# Patient Record
Sex: Female | Born: 1985 | Race: White | Hispanic: No | Marital: Married | Smoking: Never smoker
Health system: Southern US, Community
[De-identification: ages and names within clinical notes are randomized; demographics above are authoritative.]

## PROBLEM LIST (undated history)

## (undated) DIAGNOSIS — N952 Postmenopausal atrophic vaginitis: Secondary | ICD-10-CM

## (undated) DIAGNOSIS — M5030 Other cervical disc degeneration, unspecified cervical region: Secondary | ICD-10-CM

## (undated) DIAGNOSIS — F419 Anxiety disorder, unspecified: Secondary | ICD-10-CM

## (undated) DIAGNOSIS — R87629 Unspecified abnormal cytological findings in specimens from vagina: Secondary | ICD-10-CM

## (undated) HISTORY — PX: TONSILLECTOMY: SUR1361

## (undated) HISTORY — PX: COLPOSCOPY: SHX161

## (undated) HISTORY — DX: Anxiety disorder, unspecified: F41.9

## (undated) HISTORY — DX: Postmenopausal atrophic vaginitis: N95.2

## (undated) HISTORY — DX: Unspecified abnormal cytological findings in specimens from vagina: R87.629

## (undated) HISTORY — DX: Other cervical disc degeneration, unspecified cervical region: M50.30

---

## 2005-02-16 ENCOUNTER — Emergency Department: Payer: Self-pay | Admitting: Emergency Medicine

## 2005-11-29 ENCOUNTER — Emergency Department: Payer: Self-pay | Admitting: Emergency Medicine

## 2006-03-07 ENCOUNTER — Emergency Department: Payer: Self-pay | Admitting: Emergency Medicine

## 2006-07-08 ENCOUNTER — Emergency Department: Payer: Self-pay | Admitting: Emergency Medicine

## 2006-08-07 ENCOUNTER — Emergency Department: Payer: Self-pay | Admitting: Emergency Medicine

## 2006-10-08 ENCOUNTER — Emergency Department: Payer: Self-pay | Admitting: General Practice

## 2006-10-16 ENCOUNTER — Emergency Department: Payer: Self-pay

## 2007-01-06 ENCOUNTER — Observation Stay: Payer: Self-pay | Admitting: Certified Nurse Midwife

## 2007-02-06 ENCOUNTER — Observation Stay: Payer: Self-pay | Admitting: Obstetrics and Gynecology

## 2007-02-13 ENCOUNTER — Observation Stay: Payer: Self-pay

## 2007-04-02 ENCOUNTER — Ambulatory Visit: Payer: Self-pay | Admitting: Obstetrics and Gynecology

## 2007-04-03 ENCOUNTER — Inpatient Hospital Stay: Payer: Self-pay | Admitting: Obstetrics and Gynecology

## 2007-11-03 ENCOUNTER — Emergency Department: Payer: Self-pay | Admitting: Emergency Medicine

## 2007-12-08 ENCOUNTER — Encounter: Admission: RE | Admit: 2007-12-08 | Discharge: 2007-12-08 | Payer: Self-pay | Admitting: Family Medicine

## 2008-05-18 ENCOUNTER — Emergency Department: Payer: Self-pay | Admitting: Emergency Medicine

## 2008-05-27 ENCOUNTER — Encounter: Admission: RE | Admit: 2008-05-27 | Discharge: 2008-05-27 | Payer: Self-pay | Admitting: Family Medicine

## 2008-06-07 ENCOUNTER — Ambulatory Visit: Payer: Self-pay | Admitting: Family Medicine

## 2008-07-22 ENCOUNTER — Ambulatory Visit: Payer: Self-pay | Admitting: Gastroenterology

## 2008-08-20 ENCOUNTER — Ambulatory Visit: Payer: Self-pay | Admitting: Family Medicine

## 2008-11-08 ENCOUNTER — Ambulatory Visit: Payer: Self-pay | Admitting: Family Medicine

## 2008-11-23 ENCOUNTER — Encounter: Admission: RE | Admit: 2008-11-23 | Discharge: 2008-11-23 | Payer: Self-pay | Admitting: Family Medicine

## 2008-12-08 ENCOUNTER — Encounter: Admission: RE | Admit: 2008-12-08 | Discharge: 2008-12-08 | Payer: Self-pay | Admitting: Family Medicine

## 2009-11-15 HISTORY — PX: ABDOMINAL HYSTERECTOMY: SHX81

## 2010-01-31 ENCOUNTER — Ambulatory Visit: Payer: Self-pay | Admitting: Family Medicine

## 2010-02-22 ENCOUNTER — Ambulatory Visit: Payer: Self-pay

## 2010-03-16 ENCOUNTER — Ambulatory Visit: Payer: Self-pay | Admitting: Anesthesiology

## 2010-03-24 ENCOUNTER — Ambulatory Visit: Payer: Self-pay | Admitting: Anesthesiology

## 2010-04-26 ENCOUNTER — Ambulatory Visit: Payer: Self-pay | Admitting: Anesthesiology

## 2011-02-21 ENCOUNTER — Ambulatory Visit: Payer: Self-pay | Admitting: Anesthesiology

## 2011-04-02 ENCOUNTER — Ambulatory Visit: Payer: Self-pay | Admitting: Anesthesiology

## 2011-05-11 ENCOUNTER — Ambulatory Visit: Payer: Self-pay | Admitting: Anesthesiology

## 2011-06-13 ENCOUNTER — Ambulatory Visit: Payer: Self-pay | Admitting: Anesthesiology

## 2011-07-11 ENCOUNTER — Ambulatory Visit: Payer: Self-pay | Admitting: Anesthesiology

## 2012-04-09 ENCOUNTER — Emergency Department: Payer: Self-pay | Admitting: Emergency Medicine

## 2012-04-09 LAB — COMPREHENSIVE METABOLIC PANEL
Albumin: 4.1 g/dL (ref 3.4–5.0)
Anion Gap: 9 (ref 7–16)
Bilirubin,Total: 0.5 mg/dL (ref 0.2–1.0)
Potassium: 3.3 mmol/L — ABNORMAL LOW (ref 3.5–5.1)
SGOT(AST): 23 U/L (ref 15–37)
Total Protein: 7.3 g/dL (ref 6.4–8.2)

## 2012-04-09 LAB — URINALYSIS, COMPLETE
Bacteria: NONE SEEN
Glucose,UR: NEGATIVE mg/dL (ref 0–75)
Nitrite: NEGATIVE
Squamous Epithelial: 9
WBC UR: 8 /HPF (ref 0–5)

## 2012-04-09 LAB — CBC
HCT: 43.4 % (ref 35.0–47.0)
HGB: 14 g/dL (ref 12.0–16.0)
MCH: 29.3 pg (ref 26.0–34.0)
MCHC: 32.2 g/dL (ref 32.0–36.0)
MCV: 91 fL (ref 80–100)
RDW: 13.5 % (ref 11.5–14.5)

## 2013-05-12 ENCOUNTER — Emergency Department: Payer: Self-pay | Admitting: Emergency Medicine

## 2013-05-27 ENCOUNTER — Ambulatory Visit: Payer: Self-pay | Admitting: Pain Medicine

## 2013-06-09 ENCOUNTER — Ambulatory Visit: Payer: Self-pay | Admitting: Pain Medicine

## 2013-07-22 ENCOUNTER — Ambulatory Visit: Payer: Self-pay | Admitting: Family Medicine

## 2013-08-04 ENCOUNTER — Ambulatory Visit: Payer: Self-pay | Admitting: Obstetrics and Gynecology

## 2013-08-04 LAB — CBC
HCT: 43.7 % (ref 35.0–47.0)
HGB: 14.9 g/dL (ref 12.0–16.0)
MCH: 30.6 pg (ref 26.0–34.0)
MCHC: 34.1 g/dL (ref 32.0–36.0)
MCV: 90 fL (ref 80–100)
RBC: 4.87 10*6/uL (ref 3.80–5.20)

## 2013-08-04 LAB — BASIC METABOLIC PANEL
BUN: 17 mg/dL (ref 7–18)
Co2: 30 mmol/L (ref 21–32)
EGFR (Non-African Amer.): >60
Osmolality: 275 (ref 275–301)
Potassium: 3.8 mmol/L (ref 3.5–5.1)
Sodium: 137 mmol/L (ref 136–145)

## 2013-08-10 ENCOUNTER — Ambulatory Visit: Payer: Self-pay | Admitting: Obstetrics and Gynecology

## 2013-08-12 LAB — PATHOLOGY REPORT

## 2013-11-02 ENCOUNTER — Emergency Department: Payer: Self-pay | Admitting: Emergency Medicine

## 2013-11-02 LAB — BASIC METABOLIC PANEL
ANION GAP: 5 — AB (ref 7–16)
BUN: 11 mg/dL (ref 7–18)
CHLORIDE: 102 mmol/L (ref 98–107)
Calcium, Total: 9.6 mg/dL (ref 8.5–10.1)
Co2: 29 mmol/L (ref 21–32)
Creatinine: 0.81 mg/dL (ref 0.60–1.30)
EGFR (Non-African Amer.): >60
GLUCOSE: 87 mg/dL (ref 65–99)
Osmolality: 271 (ref 275–301)
POTASSIUM: 3.6 mmol/L (ref 3.5–5.1)
Sodium: 136 mmol/L (ref 136–145)

## 2013-11-02 LAB — CBC WITH DIFFERENTIAL/PLATELET
BASOS ABS: 0 10*3/uL (ref 0.0–0.1)
BASOS PCT: 0.3 %
EOS ABS: 0 10*3/uL (ref 0.0–0.7)
Eosinophil %: 0.1 %
HCT: 45.4 % (ref 35.0–47.0)
HGB: 15.3 g/dL (ref 12.0–16.0)
LYMPHS ABS: 0.9 10*3/uL — AB (ref 1.0–3.6)
LYMPHS PCT: 6.2 %
MCH: 31 pg (ref 26.0–34.0)
MCHC: 33.8 g/dL (ref 32.0–36.0)
MCV: 92 fL (ref 80–100)
MONO ABS: 0.5 x10 3/mm (ref 0.2–0.9)
MONOS PCT: 3.2 %
NEUTROS ABS: 12.8 10*3/uL — AB (ref 1.4–6.5)
Neutrophil %: 90.2 %
PLATELETS: 211 10*3/uL (ref 150–440)
RBC: 4.96 10*6/uL (ref 3.80–5.20)
RDW: 12.7 % (ref 11.5–14.5)
WBC: 14.2 10*3/uL — ABNORMAL HIGH (ref 3.6–11.0)

## 2013-11-02 LAB — HEPATIC FUNCTION PANEL A (ARMC)
AST: 24 U/L (ref 15–37)
Albumin: 4.3 g/dL (ref 3.4–5.0)
Alkaline Phosphatase: 85 U/L
BILIRUBIN DIRECT: 0.1 mg/dL (ref 0.00–0.20)
BILIRUBIN TOTAL: 0.6 mg/dL (ref 0.2–1.0)
SGPT (ALT): 20 U/L (ref 12–78)
Total Protein: 8 g/dL (ref 6.4–8.2)

## 2013-11-02 LAB — LIPASE, BLOOD: Lipase: 147 U/L (ref 73–393)

## 2013-11-02 LAB — RAPID INFLUENZA A&B ANTIGENS

## 2013-11-03 LAB — BETA STREP CULTURE(ARMC)

## 2014-01-22 ENCOUNTER — Emergency Department: Payer: Self-pay | Admitting: Emergency Medicine

## 2014-05-26 IMAGING — CT CT NECK WITH CONTRAST
4 of 5 series · 16 of 33 positions shown, 18 images · IV contrast (isovue)
Comparison: None.

CLINICAL DATA: Left-sided neck pain

EXAM:
CT NECK WITH CONTRAST
TECHNIQUE: Multidetector CT imaging of the neck was performed using the
standard protocol following the bolus administration of intravenous
contrast.
CONTRAST:  75 mL Isovue 370.

[Series 2: axial neck · axial · 0.48mm/px · z∈[+58,+196]mm · 4 of 117 slices shown, 5 images]
[im 24/117  soft-tissue]
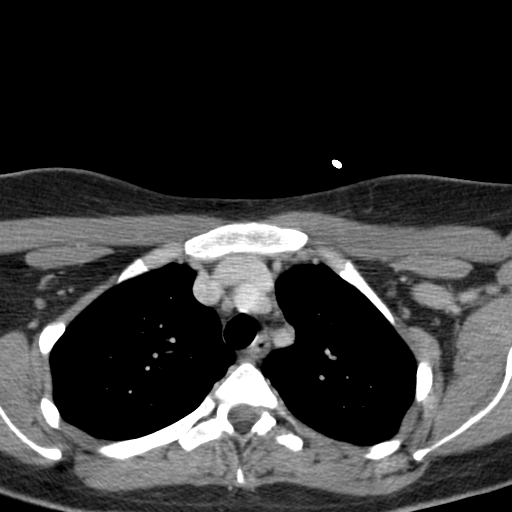
[im 24/117  bone]
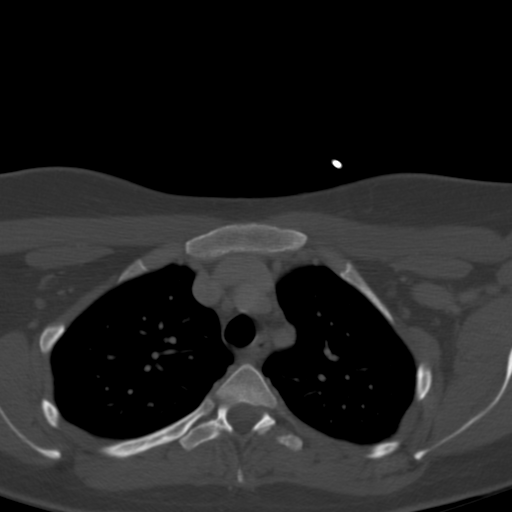
[im 47/117  bone]
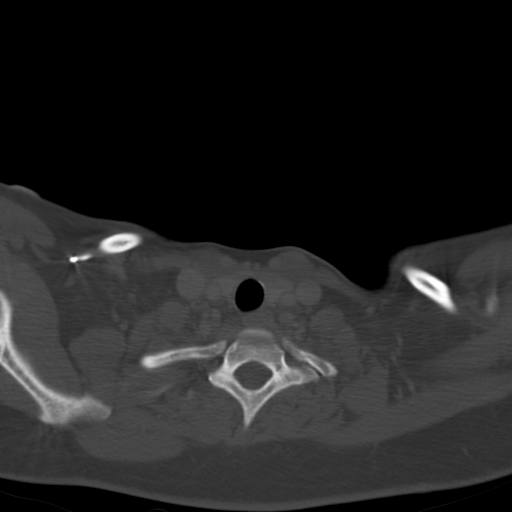
[im 70/117  bone]
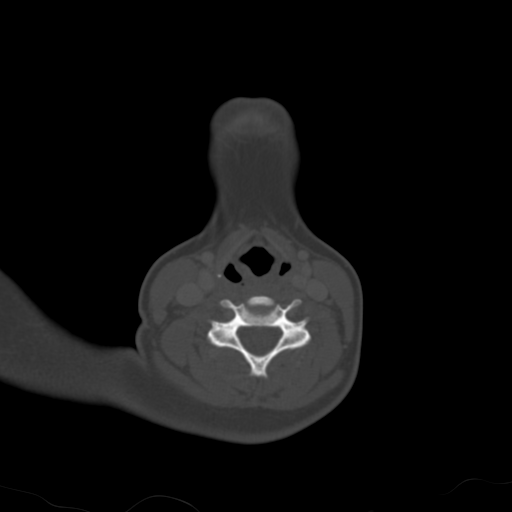
[im 93/117  bone]
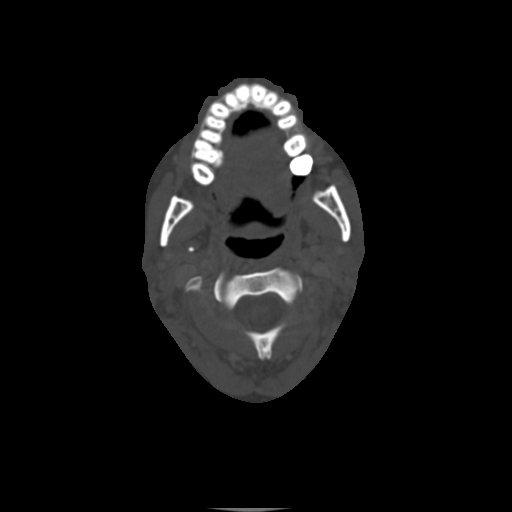

[Series 4: sag neck · sagittal · 0.42mm/px · 5 of 82 slices shown, 6 images]
[im 28/82  bone]
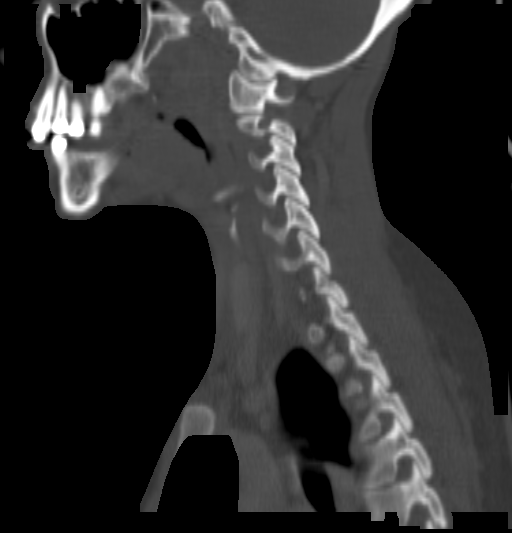
[im 34/82  bone]
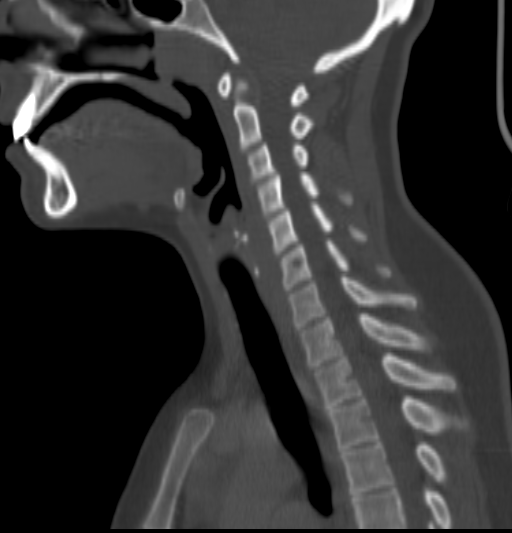
[im 41/82  soft-tissue]
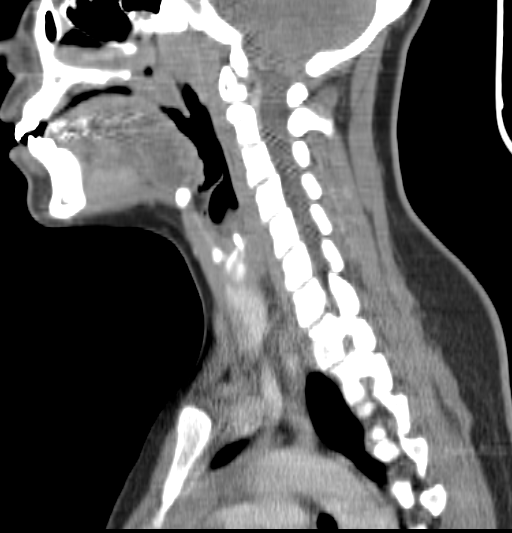
[im 41/82  bone]
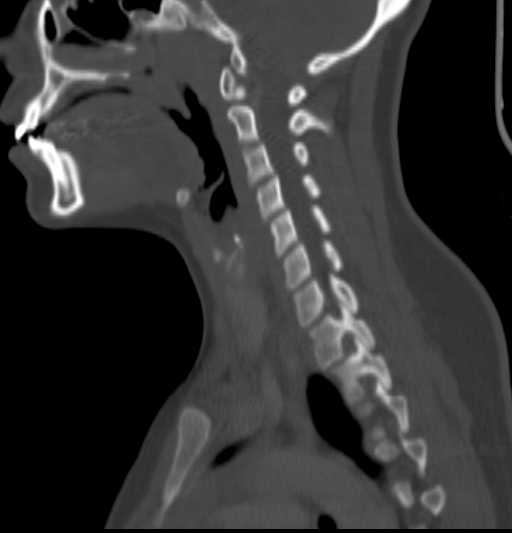
[im 48/82  bone]
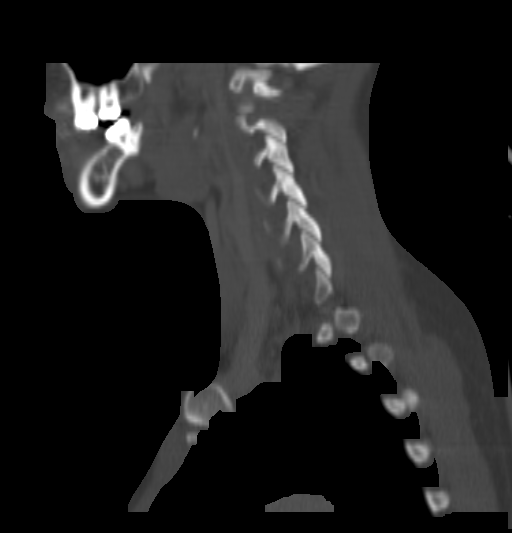
[im 55/82  bone]
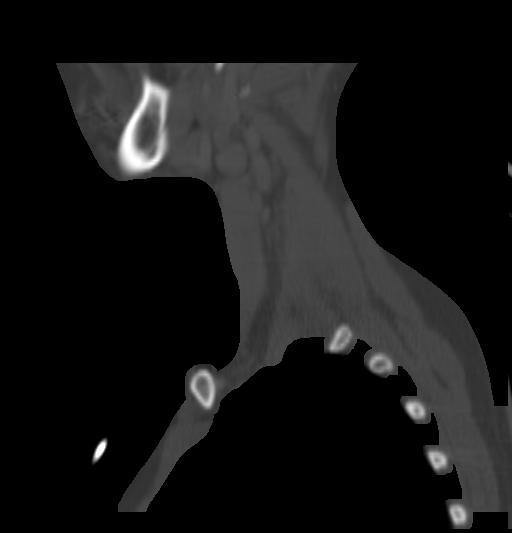

[Series 5: cor neck · coronal · 0.47mm/px · 3 of 110 slices shown]
[im 22/110  bone]
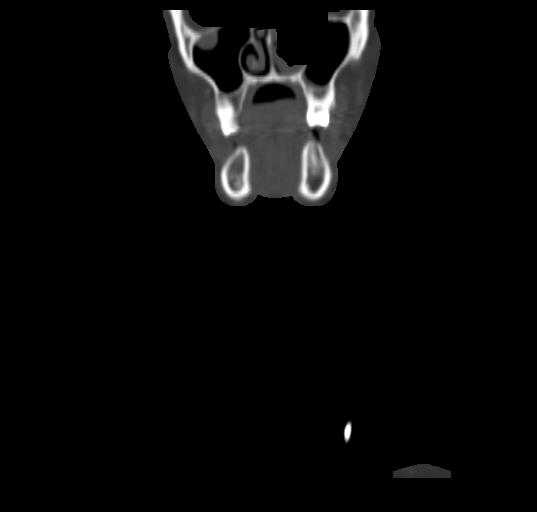
[im 44/110  bone]
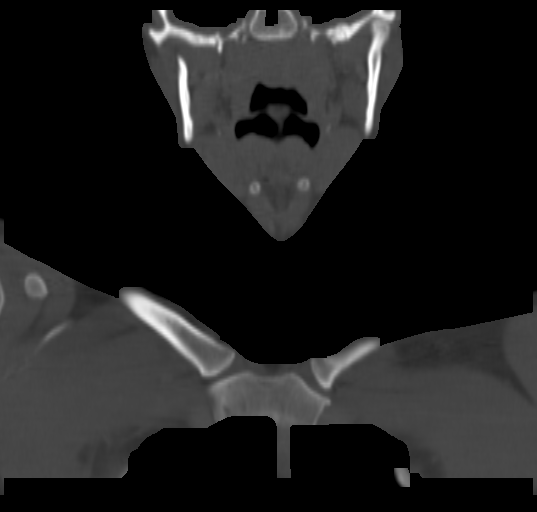
[im 66/110  bone]
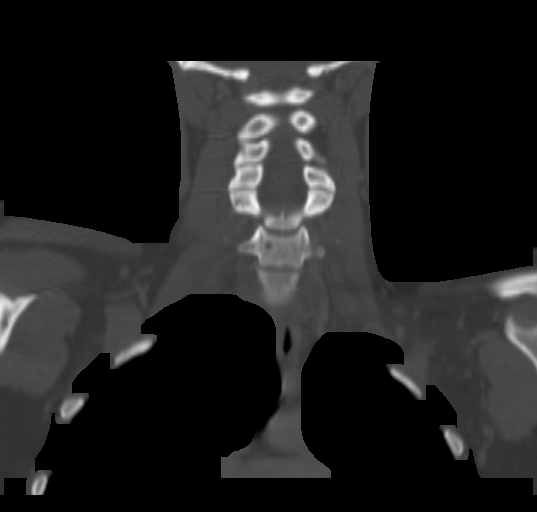

[Series 6: ax oropharynx · axial · 0.47mm/px · z∈[+17,+160]mm · 4 of 126 slices shown]
[im 26/126  bone]
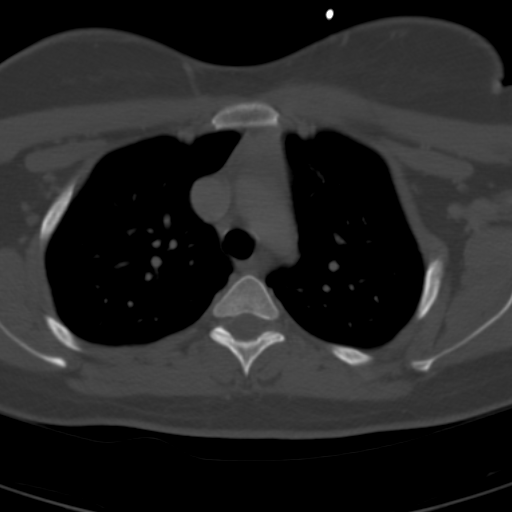
[im 51/126  bone]
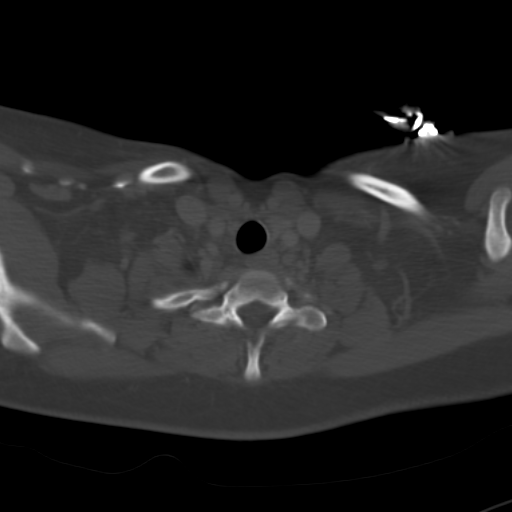
[im 76/126  bone]
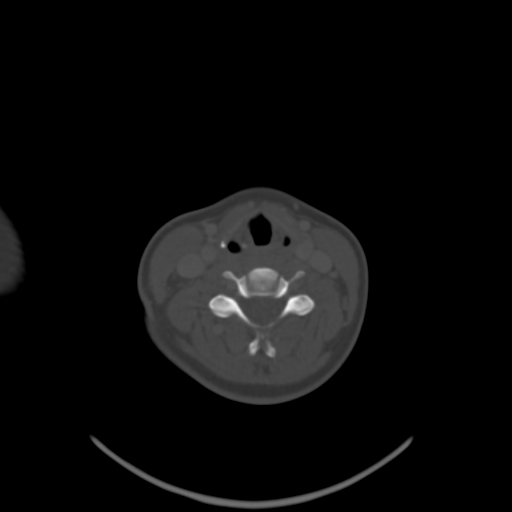
[im 101/126  bone]
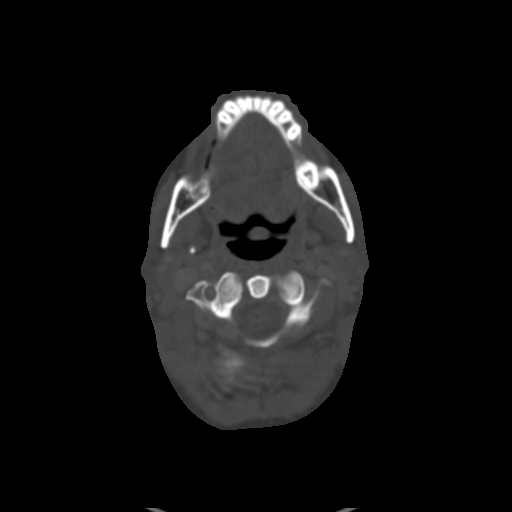

[16 of 33 positions shown; findings below may reference images not displayed]

FINDINGS: The visualized lung apices are within normal limits. The skull base
and its contents are unremarkable. The parotid gland and the
submandibular glands are well visualized bilaterally. No focal fluid
collection to suggest abscess formation is identified. There are
some bilateral lymph nodes along the jugular veins and carotid
arteries bilaterally. The largest of these is seen on image number
36 and measures 7 mm in short axis. These may be reactive in nature.
No other significant lymph nodes are noted. The visualized upper
chest is within normal limits. No acute bony abnormality is seen. A
mucosal retention cyst is noted within the right maxillary antrum.
IMPRESSION: Symmetrical lymph nodes bilaterally likely reactive in nature. No
other focal abnormality is seen.

## 2014-10-19 ENCOUNTER — Emergency Department: Payer: Self-pay | Admitting: Emergency Medicine

## 2014-11-27 LAB — HM PAP SMEAR

## 2015-01-07 NOTE — Op Note (Signed)
PATIENT NAME:  Kaitlyn Paul, Kaitlyn Paul MR#:  [id number] DATE OF BIRTH:  11-Feb-1986  DATE OF PROCEDURE:  08/10/2013  PREOPERATIVE DIAGNOSES:  1.  Chronic pelvic pain.  2.  Symptomatic endometriosis.  3.  Right adnexal mass.   POSTOPERATIVE DIAGNOSES:  1.  Chronic pelvic pain.  2.  Symptomatic endometriosis.  3.  Right hydrosalpinx.  4.  Pelvic adhesive disease.   OPERATIVE PROCEDURES: Laparoscopy with left salpingo-oophorectomy, adhesiolysis and excision of right adnexal mass.   SURGEON: Brayton Mars, M.D.   FIRST ASSISTANT: Dr. Marcelline Mates.  SECOND ASSISTANT: Hazle Coca, PA-S.   ANESTHESIA: General endotracheal.   INDICATIONS: The patient is a 29 year old white female, para 2-0-0-2, status post TVH, right oophorectomy in the past with history of endometriosis, who presents now for surgical management of chronic pelvic pain that has been progressively worsening. Left lower quadrant has been impairing activities of daily living. The patient has had increasing dyspareunia. Recent ultrasound demonstrated a cystic mass in the right adnexa that had color Doppler flow without evidence of any ovarian tissue.   FINDINGS AT SURGERY: Revealed pelvic adhesions between the left tube and ovary and pelvic sidewall. There was a 6 cm right hydrosalpinx stuck to the right pelvic sidewall and vaginal cuff.   DESCRIPTION OF PROCEDURE: The patient was brought to the operating room where she was placed in the supine position. General endotracheal anesthesia was induced without difficulty. She was placed in the dorsal lithotomy position using the bumblebee stirrups. A ChloraPrep and Betadine abdominal perineal prep and drape was performed in standard fashion. A Foley catheter was placed and was draining clear yellow urine from the bladder. A subumbilical vertical incision 5 mm in length was made. The Optiview laparoscopic trocar system was used to place a 5 mm port directly into the abdomen without evidence of bowel  or vascular injury. An 11 mm port was placed in the right lower quadrant and a 5 mm port was placed in the left lower quadrant under direct visualization. The above-noted findings were photo documented. Of significance in the right hydrosalpinx/cystic mass with evidence of possible ureteral-like structure that was coursing upward towards the cystic mass with evidence of the structure coursing through the cyst wall. Because of concern about this possibly being a duplicated ureter, urology was consulted. Dr. Elnoria Howard from urology came in to give his surgical advice intraoperatively. He felt that the structure was possibly neuro in origin and not the ureter. Indeed, there was evidence of peristalsis running below the mass consistent with the normal course of the ureter.   Using the Ace Harmonic scalpel and alligator graspers, the left tube and ovary were mobilized to a normal anatomic location through adhesiolysis. The infundibulopelvic ligament was then cross clamped, desiccated and cut using the Harmonic scalpel. The remainder of the mesosalpinx was likewise taken down and removed. The specimen was placed in the cul-de-sac while the right cystic mass was mobilized. Using the grasper and the Harmonic scalpel, this cystic mass was mobilized both from its pelvic sidewall attachments as well vaginal cuff attachments. Care was taken to avoid the ureter, which was noted to be coursing below. Eventually, the mass was mobilized from the pelvic sidewall and then excised. This cystic mass was placed into an Endo Catch bag system along with the left tube and ovary and they were then removed through the right lower quadrant port incision in standard fashion. Irrigation of the excision sites revealed good hemostasis. The irrigant fluid was aspirated. Once satisfied with completion of the  procedure, the instrumentation was removed from the abdominopelvic cavity. The fascia was closed with 0 Maxon through 2 figure-of-eight sutures.  The skin was closed with subcuticular stitch of 4-0 Vicryl. Dermabond was placed over the incisions. The procedure was then terminated with all instruments, needles and sponge counts being verified as correct. The patient was awakened, mobilized, and taken to the recovery room in satisfactory condition.   ESTIMATED BLOOD LOSS: Minimal.   COMPLICATIONS: None.   ____________________________ Alanda Slim. Ahni Bradwell, MD mad:aw D: 08/20/2013 07:27:26 ET T: 08/20/2013 07:41:16 ET JOB#: [id number]  cc: Hassell Done A. Arlen Dupuis, MD, <Dictator> Alanda Slim Maresa Morash MD ELECTRONICALLY SIGNED 08/25/2013 17:03

## 2015-06-27 ENCOUNTER — Encounter: Payer: Self-pay | Admitting: *Deleted

## 2015-06-29 ENCOUNTER — Encounter: Payer: Self-pay | Admitting: Obstetrics and Gynecology

## 2015-06-29 ENCOUNTER — Ambulatory Visit (INDEPENDENT_AMBULATORY_CARE_PROVIDER_SITE_OTHER): Payer: Self-pay | Admitting: Obstetrics and Gynecology

## 2015-06-29 ENCOUNTER — Ambulatory Visit: Payer: Self-pay | Admitting: Obstetrics and Gynecology

## 2015-06-29 VITALS — BP 102/69 | HR 108 | Ht 62.5 in | Wt 148.0 lb

## 2015-06-29 DIAGNOSIS — N958 Other specified menopausal and perimenopausal disorders: Secondary | ICD-10-CM

## 2015-06-29 DIAGNOSIS — F419 Anxiety disorder, unspecified: Secondary | ICD-10-CM | POA: Insufficient documentation

## 2015-06-29 DIAGNOSIS — D249 Benign neoplasm of unspecified breast: Secondary | ICD-10-CM | POA: Insufficient documentation

## 2015-06-29 DIAGNOSIS — Z8041 Family history of malignant neoplasm of ovary: Secondary | ICD-10-CM | POA: Insufficient documentation

## 2015-06-29 DIAGNOSIS — Z720 Tobacco use: Secondary | ICD-10-CM | POA: Insufficient documentation

## 2015-06-29 DIAGNOSIS — R87622 Low grade squamous intraepithelial lesion on cytologic smear of vagina (LGSIL): Secondary | ICD-10-CM

## 2015-06-29 DIAGNOSIS — N941 Unspecified dyspareunia: Secondary | ICD-10-CM | POA: Insufficient documentation

## 2015-06-29 DIAGNOSIS — E8940 Asymptomatic postprocedural ovarian failure: Secondary | ICD-10-CM | POA: Insufficient documentation

## 2015-06-29 DIAGNOSIS — N809 Endometriosis, unspecified: Secondary | ICD-10-CM | POA: Insufficient documentation

## 2015-06-29 DIAGNOSIS — Z8739 Personal history of other diseases of the musculoskeletal system and connective tissue: Secondary | ICD-10-CM | POA: Insufficient documentation

## 2015-06-29 DIAGNOSIS — Z90710 Acquired absence of both cervix and uterus: Secondary | ICD-10-CM

## 2015-06-29 MED ORDER — METRONIDAZOLE 500 MG PO TABS: 500.0000 mg | ORAL_TABLET | Freq: Two times a day (BID) | ORAL | Status: DC

## 2015-06-29 NOTE — Progress Notes (Signed)
Chief complaint: 1.  History of VAIN 1 2.  Vaginal discharge  Patient is a 29 year old white female, para 2002, status post TVH, status post laparoscopic excision of right adnexal mass  And LSO for history of chronic pelvic pain from endometriosis, with known history of  Vaginal dysplasia, presents for 6 month interval Pap smear. 316/16.  Patient had Pap smear with LGSIL and positive HPV.  Subsequent colopy with biopsies did not reveal dysplasia.  Patient is now here for 6 month interval Pap smear. Patient has discontinued her estrogen replacementtherapy for surgical menopause because of cost constraints with  Estratest being too expensive.  She will be joining the [employer] this month and may Regain health insurance.  Past Medical History  Diagnosis Date  . Anxiety   . Vaginal Pap smear, abnormal     LGSIL  . Vaginal atrophy   . DDD (degenerative disc disease), cervical    Past Surgical History  Procedure Laterality Date  . Colposcopy  2008,12/28/14  . Abdominal hysterectomy  11/2009  . Cesarean section  2008  . Tonsillectomy     Review of Systems  Constitutional: Negative.   Gastrointestinal: Negative.   Genitourinary: Negative.   Musculoskeletal: Negative.   Skin: Negative.   Neurological: Negative.  Speech change: VAIN 1.  Psychiatric/Behavioral: Negative.     OBJECTIVE: BP 102/69 mmHg  Pulse 108  Ht 5' 2.5" (1.588 m)  Wt 148 lb (67.132 kg)  BMI 26.62 kg/m2 Pleasant, well-appearing white female in no acute distress. Pelvic exam: External genitalia-noral BUS-noal. Vagina-gray-white discharge, slight malodor; vaginal cuff intact. Cervix-surgically absent. Uterus-surgically absent.  PROCEDURE: Wet Prep. Normal saline-clue cells present; few white blood cell present; no active Trichomonas. KOH-no hyphae  IMPRESSION: 1.  VAIN 1 2.  Vaginal discharge consistent with BV.  PLAN: 1.  Pap smear. 2.  Metronidazole 500 mg twice a day for 7 days. 3.  Return in 6  months for annual exam  Brayton Mars, MD

## 2015-07-03 LAB — PAP IG W/ RFLX HPV ASCU: PAP SMEAR COMMENT: 0

## 2015-12-06 ENCOUNTER — Encounter: Payer: Self-pay | Admitting: Obstetrics and Gynecology

## 2016-07-19 ENCOUNTER — Telehealth: Payer: Self-pay | Admitting: Obstetrics and Gynecology

## 2016-07-19 NOTE — Telephone Encounter (Signed)
Patient called stating she stopped taking her hormones about a year ago due to the cost. She has gained 30 pounds since then and would like to restart the meds. She started a new job so it may be cheaper on her new insurance. Thanks

## 2016-07-20 NOTE — Telephone Encounter (Signed)
Pt aware she will need to schedule an AE for meds. (has not been seen in over a year) Appt made for 08/28/16.

## 2016-08-27 NOTE — Progress Notes (Signed)
ANNUAL PREVENTATIVE CARE GYN  ENCOUNTER NOTE  Subjective:       Kaitlyn Paul is a 30 y.o. G2P0 female here for a routine annual gynecologic exam.  Current complaints: 1.  Weight concern; 22 pound weight gain unexplained; patient is working at with Physiological scientist and eating healthy  2. Need  hrt- estra test too expensive   Gynecologic History No LMP recorded. Patient has had a hysterectomy. Contraception: status post hysterectomy Last Pap: 11/2014 lgsil, 12/2014 colpo neg dysplasia, 06/2015 neg- reflex pap. Results were: normal Last mammogram: n./a. Results were: . Status post TVH RSO Status post laparoscopic left salpingo-oophorectomy History of endometriosis  Obstetric History OB History  Gravida Para Term Preterm AB Living  2         2  SAB TAB Ectopic Multiple Live Births          2    # Outcome Date GA Lbr Len/2nd Weight Sex Delivery Anes PTL Lv  2 Gravida 2008    F CS-Unspec   LIV  1 Gravida     F Vag-Spont   LIV      Past Medical History:  Diagnosis Date  . Anxiety   . DDD (degenerative disc disease), cervical   . Vaginal atrophy   . Vaginal Pap smear, abnormal    LGSIL    Past Surgical History:  Procedure Laterality Date  . ABDOMINAL HYSTERECTOMY  11/2009  . CESAREAN SECTION  2008  . COLPOSCOPY  2008,12/28/14  . TONSILLECTOMY      Current Outpatient Prescriptions on File Prior to Visit  Medication Sig Dispense Refill  . metroNIDAZOLE (FLAGYL) 500 MG tablet Take 1 tablet (500 mg total) by mouth 2 (two) times daily. 14 tablet 0   No current facility-administered medications on file prior to visit.     No Known Allergies  Social History   Social History  . Marital status: Single    Spouse name: N/A  . Number of children: N/A  . Years of education: N/A   Occupational History  . Not on file.   Social History Main Topics  . Smoking status: Light Tobacco Smoker  . Smokeless tobacco: Never Used  . Alcohol use Yes     Comment: occas  . Drug  use: No  . Sexual activity: Not Currently    Birth control/ protection: Surgical   Other Topics Concern  . Not on file   Social History Narrative  . No narrative on file    Family History  Problem Relation Age of Onset  . Hypertension Mother   . Thyroid disease Mother   . Atrial fibrillation Mother   . Cancer Maternal Aunt     ovarian    The following portions of the patient's history were reviewed and updated as appropriate: allergies, current medications, past family history, past medical history, past social history, past surgical history and problem list.  Review of Systems ROS Review of Systems - General ROS: negative for - chills, fatigue, fever, hot flashes, night sweats, weight gain or weight loss Psychological ROS: negative for - anxiety, decreased libido, depression, mood swings, physical abuse or sexual abuse Ophthalmic ROS: negative for - blurry vision, eye pain or loss of vision ENT ROS: negative for - headaches, hearing change, visual changes or vocal changes Allergy and Immunology ROS: negative for - hives, itchy/watery eyes or seasonal allergies Hematological and Lymphatic ROS: negative for - bleeding problems, bruising, swollen lymph nodes or weight loss Endocrine ROS: negative for - galactorrhea,  hair pattern changes, hot flashes, malaise/lethargy, mood swings, palpitations, polydipsia/polyuria, skin changes, temperature intolerance or unexpected weight changes Breast ROS: negative for - new or changing breast lumps or nipple discharge Respiratory ROS: negative for - cough or shortness of breath Cardiovascular ROS: negative for - chest pain, irregular heartbeat, palpitations or shortness of breath Gastrointestinal ROS: no abdominal pain, change in bowel habits, or black or bloody stools Genito-Urinary ROS: no dysuria, trouble voiding, or hematuria Musculoskeletal ROS: negative for - joint pain or joint stiffness Neurological ROS: negative for - bowel and bladder  control changes Dermatological ROS: negative for rash and skin lesion changes   Objective:   BP 106/69   Pulse 76   Ht 5\' 2"  (1.575 m)   Wt 170 lb 1.6 oz (77.2 kg)   BMI 31.11 kg/m  CONSTITUTIONAL: Well-developed, well-nourished female in no acute distress.  PSYCHIATRIC: Normal mood and affect. Normal behavior. Normal judgment and thought content. [place]: Alert and oriented to person, place, and time. Normal muscle tone coordination. No cranial nerve deficit noted. HENT:  Normocephalic, atraumatic, External right and left ear normal. Oropharynx is clear and moist EYES: Conjunctivae and EOM are normal. Pupils are equal, round, and reactive to light. No scleral icterus.  NECK: Normal range of motion, supple, no masses.  Normal thyroid.  SKIN: Skin is warm and dry. No rash noted. Not diaphoretic. No erythema. No pallor. CARDIOVASCULAR: Normal heart rate noted, regular rhythm, no murmur. RESPIRATORY: Clear to auscultation bilaterally. Effort and breath sounds normal, no problems with respiration noted. BREASTS: Symmetric in size. No masses, skin changes, nipple drainage, or lymphadenopathy. ABDOMEN: Soft, normal bowel sounds, no distention noted.  No tenderness, rebound or guarding.  BLADDER: Normal PELVIC:  External Genitalia: Normal  BUS: Normal  Vagina: Mild atrophic changes  Cervix: Surgically absent  Uterus: Surgically absent  Adnexa: Normal; no palpable masses or tenderness  RV: External Exam NormaI  MUSCULOSKELETAL: Normal range of motion. No tenderness.  No cyanosis, clubbing, or edema.  2+ distal pulses. LYMPHATIC: No Axillary, Supraclavicular, or Inguinal Adenopathy.    Assessment:   Annual gynecologic examination 30 y.o. Contraception: status post hysterectomy BMI-31 Problem List Items Addressed This Visit    Status post vaginal hysterectomy   Surgical menopause   Anxiety    Other Visit Diagnoses    Well woman exam with routine gynecological exam    -  Primary    LGSIL Pap smear of vagina        Unexplained weight gain  Plan:  Pap: Pap Co Test Mammogram: Not Indicated Stool Guaiac Testing:  Not Indicated Labs: lipid vit d fbs a1c tsh Routine preventative health maintenance measures emphasized: Exercise/Diet/Weight control, Tobacco Warnings and Alcohol/Substance use risks Estradiol 1 mg a day ordered Return to [place], CMA  Brayton Mars, MD  Note: This dictation was prepared with Dragon dictation along with smaller phrase technology. Any transcriptional errors that result from this process are unintentional.

## 2016-08-28 ENCOUNTER — Encounter: Payer: Self-pay | Admitting: Obstetrics and Gynecology

## 2016-08-28 ENCOUNTER — Ambulatory Visit (INDEPENDENT_AMBULATORY_CARE_PROVIDER_SITE_OTHER): Payer: 59 | Admitting: Obstetrics and Gynecology

## 2016-08-28 VITALS — BP 106/69 | HR 76 | Ht 62.0 in | Wt 170.1 lb

## 2016-08-28 DIAGNOSIS — E8940 Asymptomatic postprocedural ovarian failure: Secondary | ICD-10-CM

## 2016-08-28 DIAGNOSIS — R87622 Low grade squamous intraepithelial lesion on cytologic smear of vagina (LGSIL): Secondary | ICD-10-CM

## 2016-08-28 DIAGNOSIS — Z01419 Encounter for gynecological examination (general) (routine) without abnormal findings: Secondary | ICD-10-CM

## 2016-08-28 DIAGNOSIS — F419 Anxiety disorder, unspecified: Secondary | ICD-10-CM

## 2016-08-28 DIAGNOSIS — Z90710 Acquired absence of both cervix and uterus: Secondary | ICD-10-CM | POA: Diagnosis not present

## 2016-08-28 MED ORDER — ESTRADIOL 1 MG PO TABS: 1.0000 mg | ORAL_TABLET | Freq: Every day | ORAL | 3 refills | Status: DC

## 2016-08-28 NOTE — Patient Instructions (Signed)
1. Pap smear is performed 2. Estradiol prescription is written for ERT (Estratest discontinued due to cost) 3. Screening lab work is ordered 4. Continue with healthy eating and exercise with controlled weight loss 5. Return in 1 year for physical  Health Maintenance, Female Introduction Adopting a healthy lifestyle and getting preventive care can go a long way to promote health and wellness. Talk with your health care provider about what schedule of regular examinations is right for you. This is a good chance for you to check in with your provider about disease prevention and staying healthy. In between checkups, there are plenty of things you can do on your own. Experts have done a lot of research about which lifestyle changes and preventive measures are most likely to keep you healthy. Ask your health care provider for more information. Weight and diet Eat a healthy diet  Be sure to include plenty of vegetables, fruits, low-fat dairy products, and lean protein.  Do not eat a lot of foods high in solid fats, added sugars, or salt.  Get regular exercise. This is one of the most important things you can do for your health.  Most adults should exercise for at least 150 minutes each week. The exercise should increase your heart rate and make you sweat (moderate-intensity exercise).  Most adults should also do strengthening exercises at least twice a week. This is in addition to the moderate-intensity exercise. Maintain a healthy weight  Body mass index (BMI) is a measurement that can be used to identify possible weight problems. It estimates body fat based on height and weight. Your health care provider can help determine your BMI and help you achieve or maintain a healthy weight.  For females 32 years of age and older:  A BMI below 18.5 is considered underweight.  A BMI of 18.5 to 24.9 is normal.  A BMI of 25 to 29.9 is considered overweight.  A BMI of 30 and above is considered  obese. Watch levels of cholesterol and blood lipids  You should start having your blood tested for lipids and cholesterol at 31 years of age, then have this test every 5 years.  You may need to have your cholesterol levels checked more often if:  Your lipid or cholesterol levels are high.  You are older than 31 years of age.  You are at high risk for heart disease. Cancer screening Lung Cancer  Lung cancer screening is recommended for adults 64-106 years old who are at high risk for lung cancer because of a history of smoking.  A yearly low-dose CT scan of the lungs is recommended for people who:  Currently smoke.  Have quit within the past 15 years.  Have at least a 30-pack-year history of smoking. A pack year is smoking an average of one pack of cigarettes a day for 1 year.  Yearly screening should continue until it has been 15 years since you quit.  Yearly screening should stop if you develop a health problem that would prevent you from having lung cancer treatment. Breast Cancer  Practice breast self-awareness. This means understanding how your breasts normally appear and feel.  It also means doing regular breast self-exams. Let your health care provider know about any changes, no matter how small.  If you are in your 20s or 30s, you should have a clinical breast exam (CBE) by a health care provider every 1-3 years as part of a regular health exam.  If you are 40 or older, have  a CBE every year. Also consider having a breast X-ray (mammogram) every year.  If you have a family history of breast cancer, talk to your health care provider about genetic screening.  If you are at high risk for breast cancer, talk to your health care provider about having an MRI and a mammogram every year.  Breast cancer gene (BRCA) assessment is recommended for women who have family members with BRCA-related cancers. BRCA-related cancers include:  Breast.  Ovarian.  Tubal.  Peritoneal  cancers.  Results of the assessment will determine the need for genetic counseling and BRCA1 and BRCA2 testing. Cervical Cancer  Your health care provider may recommend that you be screened regularly for cancer of the pelvic organs (ovaries, uterus, and vagina). This screening involves a pelvic examination, including checking for microscopic changes to the surface of your cervix (Pap test). You may be encouraged to have this screening done every 3 years, beginning at age 26.  For women ages 40-65, health care providers may recommend pelvic exams and Pap testing every 3 years, or they may recommend the Pap and pelvic exam, combined with testing for human papilloma virus (HPV), every 5 years. Some types of HPV increase your risk of cervical cancer. Testing for HPV may also be done on women of any age with unclear Pap test results.  Other health care providers may not recommend any screening for nonpregnant women who are considered low risk for pelvic cancer and who do not have symptoms. Ask your health care provider if a screening pelvic exam is right for you.  If you have had past treatment for cervical cancer or a condition that could lead to cancer, you need Pap tests and screening for cancer for at least 20 years after your treatment. If Pap tests have been discontinued, your risk factors (such as having a new sexual partner) need to be reassessed to determine if screening should resume. Some women have medical problems that increase the chance of getting cervical cancer. In these cases, your health care provider may recommend more frequent screening and Pap tests. Colorectal Cancer  This type of cancer can be detected and often prevented.  Routine colorectal cancer screening usually begins at 31 years of age and continues through 31 years of age.  Your health care provider may recommend screening at an earlier age if you have risk factors for colon cancer.  Your health care provider may also  recommend using home test kits to check for hidden blood in the stool.  A small camera at the end of a tube can be used to examine your colon directly (sigmoidoscopy or colonoscopy). This is done to check for the earliest forms of colorectal cancer.  Routine screening usually begins at age 66.  Direct examination of the colon should be repeated every 5-10 years through 31 years of age. However, you may need to be screened more often if early forms of precancerous polyps or small growths are found. Skin Cancer  Check your skin from head to toe regularly.  Tell your health care provider about any new moles or changes in moles, especially if there is a change in a mole's shape or color.  Also tell your health care provider if you have a mole that is larger than the size of a pencil eraser.  Always use sunscreen. Apply sunscreen liberally and repeatedly throughout the day.  Protect yourself by wearing long sleeves, pants, a wide-brimmed hat, and sunglasses whenever you are outside. Heart disease, diabetes, and  high blood pressure  High blood pressure causes heart disease and increases the risk of stroke. High blood pressure is more likely to develop in:  People who have blood pressure in the high end of the normal range (130-139/85-89 mm Hg).  People who are overweight or obese.  People who are African American.  If you are 16-23 years of age, have your blood pressure checked every 3-5 years. If you are 25 years of age or older, have your blood pressure checked every year. You should have your blood pressure measured twice-once when you are at a hospital or clinic, and once when you are not at a hospital or clinic. Record the average of the two measurements. To check your blood pressure when you are not at a hospital or clinic, you can use:  An automated blood pressure machine at a pharmacy.  A home blood pressure monitor.  If you are between 53 years and 75 years old, ask your health  care provider if you should take aspirin to prevent strokes.  Have regular diabetes screenings. This involves taking a blood sample to check your fasting blood sugar level.  If you are at a normal weight and have a low risk for diabetes, have this test once every three years after 31 years of age.  If you are overweight and have a high risk for diabetes, consider being tested at a younger age or more often. Preventing infection Hepatitis B  If you have a higher risk for hepatitis B, you should be screened for this virus. You are considered at high risk for hepatitis B if:  You were born in a country where hepatitis B is common. Ask your health care provider which countries are considered high risk.  Your parents were born in a high-risk country, and you have not been immunized against hepatitis B (hepatitis B vaccine).  You have HIV or AIDS.  You use needles to inject street drugs.  You live with someone who has hepatitis B.  You have had sex with someone who has hepatitis B.  You get hemodialysis treatment.  You take certain medicines for conditions, including cancer, organ transplantation, and autoimmune conditions. Hepatitis C  Blood testing is recommended for:  Everyone born from [birth year] through 1966.  Anyone with known risk factors for hepatitis C. Sexually transmitted infections (STIs)  You should be screened for sexually transmitted infections (STIs) including gonorrhea and chlamydia if:  You are sexually active and are younger than 31 years of age.  You are older than 31 years of age and your health care provider tells you that you are at risk for this type of infection.  Your sexual activity has changed since you were last screened and you are at an increased risk for chlamydia or gonorrhea. Ask your health care provider if you are at risk.  If you do not have HIV, but are at risk, it may be recommended that you take a prescription medicine daily to prevent HIV  infection. This is called pre-exposure prophylaxis (PrEP). You are considered at risk if:  You are sexually active and do not regularly use condoms or know the HIV status of your partner(s).  You take drugs by injection.  You are sexually active with a partner who has HIV. Talk with your health care provider about whether you are at high risk of being infected with HIV. If you choose to begin PrEP, you should first be tested for HIV. You should then be tested every 3  months for as long as you are taking PrEP. Pregnancy  If you are premenopausal and you may become pregnant, ask your health care provider about preconception counseling.  If you may become pregnant, take 400 to 800 micrograms (mcg) of folic acid every day.  If you want to prevent pregnancy, talk to your health care provider about birth control (contraception). Osteoporosis and menopause  Osteoporosis is a disease in which the bones lose minerals and strength with aging. This can result in serious bone fractures. Your risk for osteoporosis can be identified using a bone density scan.  If you are 53 years of age or older, or if you are at risk for osteoporosis and fractures, ask your health care provider if you should be screened.  Ask your health care provider whether you should take a calcium or vitamin D supplement to lower your risk for osteoporosis.  Menopause may have certain physical symptoms and risks.  Hormone replacement therapy may reduce some of these symptoms and risks. Talk to your health care provider about whether hormone replacement therapy is right for you. Follow these instructions at home:  Schedule regular health, dental, and eye exams.  Stay current with your immunizations.  Do not use any tobacco products including cigarettes, chewing tobacco, or electronic cigarettes.  If you are pregnant, do not drink alcohol.  If you are breastfeeding, limit how much and how often you drink alcohol.  Limit  alcohol intake to no more than 1 drink per day for nonpregnant women. One drink equals 12 ounces of beer, 5 ounces of wine, or 1 ounces of hard liquor.  Do not use street drugs.  Do not share needles.  Ask your health care provider for help if you need support or information about quitting drugs.  Tell your health care provider if you often feel depressed.  Tell your health care provider if you have ever been abused or do not feel safe at home. This information is not intended to replace advice given to you by your health care provider. Make sure you discuss any questions you have with your health care provider. Document Released: 03/19/2011 Document Revised: 02/09/2016 Document Reviewed: 06/07/2015  2017 [employer]vier

## 2016-08-29 ENCOUNTER — Other Ambulatory Visit: Payer: 59

## 2016-08-30 LAB — LIPID PANEL
CHOLESTEROL TOTAL: 173 mg/dL (ref 100–199)
Chol/HDL Ratio: 2.7 ratio units (ref 0.0–4.4)
HDL: 64 mg/dL (ref >39)
LDL Calculated: 82 mg/dL (ref 0–99)
TRIGLYCERIDES: 136 mg/dL (ref 0–149)
VLDL Cholesterol Cal: 27 mg/dL (ref 5–40)

## 2016-08-30 LAB — HEMOGLOBIN A1C
ESTIMATED AVERAGE GLUCOSE: 103 mg/dL
HEMOGLOBIN A1C: 5.2 % (ref 4.8–5.6)

## 2016-08-30 LAB — GLUCOSE, RANDOM: GLUCOSE: 86 mg/dL (ref 65–99)

## 2016-08-30 LAB — TSH: TSH: 2.57 u[IU]/mL (ref 0.450–4.500)

## 2016-08-30 LAB — VITAMIN D 25 HYDROXY (VIT D DEFICIENCY, FRACTURES): Vit D, 25-Hydroxy: 31.8 ng/mL (ref 30.0–100.0)

## 2016-08-31 LAB — PAP IG AND HPV HIGH-RISK
HPV, HIGH-RISK: NEGATIVE
PAP Smear Comment: 0

## 2018-06-09 ENCOUNTER — Emergency Department
Admission: EM | Admit: 2018-06-09 | Discharge: 2018-06-09 | Disposition: A | Discharge status: Home / Self Care | Payer: Self-pay | Attending: Emergency Medicine | Admitting: Emergency Medicine

## 2018-06-09 ENCOUNTER — Emergency Department: Payer: Self-pay

## 2018-06-09 ENCOUNTER — Other Ambulatory Visit: Payer: Self-pay

## 2018-06-09 ENCOUNTER — Encounter: Payer: Self-pay | Admitting: Emergency Medicine

## 2018-06-09 DIAGNOSIS — Z79899 Other long term (current) drug therapy: Secondary | ICD-10-CM | POA: Insufficient documentation

## 2018-06-09 DIAGNOSIS — R519 Headache, unspecified: Secondary | ICD-10-CM

## 2018-06-09 DIAGNOSIS — R109 Unspecified abdominal pain: Secondary | ICD-10-CM | POA: Insufficient documentation

## 2018-06-09 DIAGNOSIS — R42 Dizziness and giddiness: Secondary | ICD-10-CM | POA: Insufficient documentation

## 2018-06-09 DIAGNOSIS — R51 Headache: Secondary | ICD-10-CM | POA: Insufficient documentation

## 2018-06-09 DIAGNOSIS — D72819 Decreased white blood cell count, unspecified: Secondary | ICD-10-CM | POA: Insufficient documentation

## 2018-06-09 LAB — URINALYSIS, COMPLETE (UACMP) WITH MICROSCOPIC
Bacteria, UA: NONE SEEN
Bilirubin Urine: NEGATIVE
Glucose, UA: NEGATIVE mg/dL
KETONES UR: NEGATIVE mg/dL
NITRITE: NEGATIVE
PROTEIN: NEGATIVE mg/dL
Specific Gravity, Urine: 1.012 (ref 1.005–1.030)
pH: 6 (ref 5.0–8.0)

## 2018-06-09 LAB — CBC
HCT: 41.7 % (ref 35.0–47.0)
HEMOGLOBIN: 14.5 g/dL (ref 12.0–16.0)
MCH: 30.1 pg (ref 26.0–34.0)
MCHC: 34.9 g/dL (ref 32.0–36.0)
MCV: 86.4 fL (ref 80.0–100.0)
Platelets: 128 10*3/uL — ABNORMAL LOW (ref 150–440)
RBC: 4.82 MIL/uL (ref 3.80–5.20)
RDW: 13.3 % (ref 11.5–14.5)
WBC: 3.1 10*3/uL — AB (ref 3.6–11.0)

## 2018-06-09 LAB — BASIC METABOLIC PANEL
ANION GAP: 7 (ref 5–15)
BUN: 13 mg/dL (ref 6–20)
CALCIUM: 8.7 mg/dL — AB (ref 8.9–10.3)
CO2: 26 mmol/L (ref 22–32)
Chloride: 102 mmol/L (ref 98–111)
Creatinine, Ser: 0.77 mg/dL (ref 0.44–1.00)
Glucose, Bld: 103 mg/dL — ABNORMAL HIGH (ref 70–99)
Potassium: 4.1 mmol/L (ref 3.5–5.1)
Sodium: 135 mmol/L (ref 135–145)

## 2018-06-09 MED ORDER — ACETAMINOPHEN 325 MG PO TABS
650.0000 mg | ORAL_TABLET | Freq: Once | ORAL | Status: AC
  Administered 2018-06-09: 650 mg via ORAL
  Filled 2018-06-09: qty 2

## 2018-06-09 MED ORDER — SODIUM CHLORIDE 0.9 % IV BOLUS
1000.0000 mL | Freq: Once | INTRAVENOUS | Status: AC
  Administered 2018-06-09: 1000 mL via INTRAVENOUS

## 2018-06-09 NOTE — ED Notes (Signed)
Pt returned from xray

## 2018-06-09 NOTE — Discharge Instructions (Addendum)
Please call and schedule a follow up with primary care. I recommend having labs rechecked in about a week if possible. Your white blood cell count and platelet count are slightly lower than normal. Return to the ER for symptoms that change or worsen if unable to schedule an appointment.

## 2018-06-09 NOTE — ED Triage Notes (Addendum)
Pt states she has been feeling dizzy off and on since Friday, has been feeling like she is in a "fog." has a hx of migraines but has been years since she has had one. This pain feels like she has had her hair up for too long-tenderness when the hair band is removed-that is how she describes the pain. States she had been running a temp over weekend.  Also c/o numbness in her arms for months now when she sleeps. Upper abd pain began while at work today, has been nauseated for the past 3 days.

## 2018-06-09 NOTE — ED Provider Notes (Signed)
[facility] Emergency Department Provider Note  ____________________________________________   First MD Initiated Contact with Patient 06/09/18 1738     (approximate)  I have reviewed the triage vital signs and the nursing notes.   HISTORY  Chief Complaint Dizziness; Abdominal Cramping; and Fever   HPI Kaitlyn Paul is a 32 y.o. female who presents to the emergency department for treatment and evaluation of multiple medical complaints. She states that for several months she has experienced numbness and tingling in both arms at night, which resolves during the day and does not return until she goes to bed. She complains that she has felt as if she is in a "fog" since Friday with a nagging headache. The headache is not abnormal, but the "fog" feeling is unusual. She took tylenol with some relief--none today. She awakened last night in a sweat and assumed she had a fever that broke, but also states that she is menopausal and sweats at night sometimes. This afternoon, she had some abdominal cramping without nausea, vomiting, or diarrhea. This has resolved since lying on the ER stretcher. 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 Unsure if she was exposed to some type of illness at the daycare.   Past Medical History:  Diagnosis Date  . Anxiety   . DDD (degenerative disc disease), cervical   . Vaginal atrophy   . Vaginal Pap smear, abnormal    LGSIL    Patient Active Problem List   Diagnosis Date Noted  . Endometriosis 06/29/2015  . Status post vaginal hysterectomy 06/29/2015  . Surgical menopause 06/29/2015  . Anxiety 06/29/2015  . H/O degenerative disc disease 06/29/2015  . 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 Tobacco user 06/29/2015  . Dyspareunia in female 06/29/2015  . Family history of ovarian carcinoma 06/29/2015    Past Surgical History:  Procedure Laterality  Date  . ABDOMINAL HYSTERECTOMY  11/2009  . CESAREAN SECTION  2008  . COLPOSCOPY  2008,12/28/14  . TONSILLECTOMY      Prior to Admission medications   Medication Sig Start Date End Date Taking? Authorizing Provider  Biotin 1000 MCG tablet Take 1,000 mcg by mouth 3 (three) times daily.    [provider]  estradiol (ESTRACE) 1 MG tablet Take 1 tablet (1 mg total) by mouth daily. 08/28/16   Defrancesco, Alanda Slim, MD    Allergies Patient has no known allergies.  Family History  Problem Relation Age of Onset  . Hypertension Mother   . Thyroid disease Mother   . Atrial fibrillation Mother   . Cancer Maternal Aunt        ovarian  . Ovarian cancer Maternal Aunt   . Diabetes Paternal Uncle   . Diabetes Paternal Grandmother   . Breast cancer Neg Hx   . Colon cancer Neg Hx     Social History Social History   Tobacco Use  . Smoking status: Never Smoker  . Smokeless tobacco: Never Used  Substance Use Topics  . Alcohol use: Yes    Comment: occas  . Drug use: No    Review of Systems Constitutional: Possible fever/ no chills Eyes: No visual changes. ENT: No sore throat. Cardiovascular: Denies chest pain. Respiratory: Denies shortness of breath. Gastrointestinal: Positive for abdominal cramping.  No nausea, no vomiting.  No diarrhea.  No constipation. Genitourinary: Negative for dysuria. Musculoskeletal: Negative for back pain. Skin: Negative for rash. Neurological: Positive for headaches,  No focal weakness or numbness. ____________________________________________   PHYSICAL EXAM:  VITAL SIGNS: ED Triage Vitals  Enc Vitals Group     BP 06/09/18 1616 120/70     Pulse Rate 06/09/18 1616 98     Resp 06/09/18 1616 18     Temp 06/09/18 1616 100.3 F (37.9 C)     Temp Source 06/09/18 1616 Oral     SpO2 06/09/18 1616 98 %     Weight 06/09/18 1618 160 lb (72.6 kg)     Height 06/09/18 1618 5\' 2"  (1.575 m)     Head Circumference --      Peak Flow --      Pain  Score 06/09/18 1617 6     Pain Loc --      Pain Edu? --      Excl. in [place]? --     Constitutional: Alert and oriented. Well appearing and in no acute distress. Eyes: Conjunctivae are normal. PERRL. EOMI. Head: Atraumatic. Nose: No congestion/rhinnorhea. Mouth/Throat: Mucous membranes are moist.  Oropharynx non-erythematous. Neck: No stridor.   Cardiovascular: Normal rate, regular rhythm. Grossly normal heart sounds.  Good peripheral circulation. Respiratory: Normal respiratory effort.  No retractions. Lungs CTAB. Gastrointestinal: Soft and nontender. No distention. No abdominal bruits. No CVA tenderness. Musculoskeletal: No lower extremity tenderness nor edema.  No joint effusions. Neurologic:  Normal speech and language. No gross focal neurologic deficits are appreciated. No gait instability. Skin:  Skin is warm, dry and intact. No rash noted. Psychiatric: Mood and affect are normal. Speech and behavior are normal.  ____________________________________________   LABS (all labs ordered are listed, but only abnormal results are displayed)  Labs Reviewed  BASIC METABOLIC PANEL - Abnormal; Notable for the following components:      Result Value   Glucose, Bld 103 (*)    Calcium 8.7 (*)    All other components within normal limits  CBC - Abnormal; Notable for the following components:   WBC 3.1 (*)    Platelets 128 (*)    All other components within normal limits  URINALYSIS, COMPLETE (UACMP) WITH MICROSCOPIC - Abnormal; Notable for the following components:   Color, Urine YELLOW (*)    APPearance CLEAR (*)    Hgb urine dipstick SMALL (*)    Leukocytes, UA TRACE (*)    All other components within normal limits   ____________________________________________  EKG  ED ECG REPORT I, Sherrie George, the attending physician, personally viewed and interpreted this ECG.   Date: 06/09/2018  EKG Time: 4:20 PM  Rate: 97  Rhythm: nonspecific ST and T waves changes  Axis: normal   Intervals:none  ST&T Change: none  ____________________________________________  RADIOLOGY  ED MD interpretation:  Chest x-ray negative for acute findings  Official radiology report(s): Dg Chest 2 View  Result Date: 06/09/2018 CLINICAL DATA:  Fever. EXAM: CHEST - 2 VIEW COMPARISON:  11/02/2013 FINDINGS: The heart size and mediastinal contours are within normal limits. Both lungs are clear. The visualized skeletal structures are unremarkable. IMPRESSION: No active cardiopulmonary disease. Electronically Signed   By: Markus Daft M.D.   On: 06/09/2018 20:47    ____________________________________________   PROCEDURES  Procedure(s) performed: None  Procedures  Critical Care performed: No  ____________________________________________   INITIAL IMPRESSION / ASSESSMENT AND PLAN / ED COURSE  As part of my medical decision making, I reviewed the following data within the electronic MEDICAL RECORD NUMBER    32 year old female presenting to the emergency department for treatment and evaluation of headache,  feeling as if she is in a fog and intermittent abdominal cramping.  Exam is reassuring.  Labs are nonspecific, but she was advised that she should have a repeat CBC for incidental findings.  Additionally, she denies any urinary symptoms including dysuria or frequency despite trace amount of leukocytes and 6-10 white blood cell in the urinalysis.  While here, she was given a liter of normal saline and Tylenol which relieved her headache completely.  She denied any additional abdominal pain or cramping.  She will be discharged home and given follow-up information for [employer].  She was encouraged to return to the emergency department for symptoms of change or worsen if she is unable to schedule an appointment.  _________________________________________  FINAL CLINICAL IMPRESSION(S) / ED DIAGNOSES  Final diagnoses:  Acute intractable headache, unspecified headache type    Abdominal cramping  Dizziness  Leukopenia, unspecified type     ED Discharge Orders    None       Note:  This document was prepared using Dragon voice recognition software and may include unintentional dictation errors.    Victorino Dike, FNP 06/10/18 0225    Eula Listen, MD 06/12/18 2152

## 2019-09-01 ENCOUNTER — Ambulatory Visit (INDEPENDENT_AMBULATORY_CARE_PROVIDER_SITE_OTHER): Payer: BC Managed Care – PPO | Admitting: Obstetrics and Gynecology

## 2019-09-01 ENCOUNTER — Other Ambulatory Visit: Payer: Self-pay

## 2019-09-01 ENCOUNTER — Encounter: Payer: Self-pay | Admitting: Obstetrics and Gynecology

## 2019-09-01 VITALS — BP 108/78 | HR 73 | Ht 62.5 in | Wt 171.8 lb

## 2019-09-01 DIAGNOSIS — N951 Menopausal and female climacteric states: Secondary | ICD-10-CM

## 2019-09-01 DIAGNOSIS — Z01419 Encounter for gynecological examination (general) (routine) without abnormal findings: Secondary | ICD-10-CM

## 2019-09-01 DIAGNOSIS — B9689 Other specified bacterial agents as the cause of diseases classified elsewhere: Secondary | ICD-10-CM | POA: Diagnosis not present

## 2019-09-01 DIAGNOSIS — N760 Acute vaginitis: Secondary | ICD-10-CM

## 2019-09-01 MED ORDER — LEVONORGEST-ETH ESTRAD 91-DAY 0.15-0.03 &0.01 MG PO TABS: 1.0000 | ORAL_TABLET | Freq: Every day | ORAL | 1 refills | Status: AC

## 2019-09-01 MED ORDER — METRONIDAZOLE 500 MG PO TABS: 500.0000 mg | ORAL_TABLET | Freq: Two times a day (BID) | ORAL | 0 refills | Status: AC

## 2019-09-01 NOTE — Progress Notes (Signed)
HPI:      Kaitlyn Paul is a 33 y.o. G2P0 who LMP was No LMP recorded. Patient has had a hysterectomy.  Subjective:   She presents today for her annual examination.  Of significant note patient states she has not been sexually active in the last 4 years. She had previous endometriosis and now has no ovaries uterus or cervix.  She is in surgical menopause.  She stopped taking hormones because of insurance issues. She complains of a right labial irritation that she would like me to look at. Reports occasional asymptomatic vaginal discharge.    Hx: The following portions of the patient's history were reviewed and updated as appropriate:             She  has a past medical history of Anxiety, DDD (degenerative disc disease), cervical, Vaginal atrophy, and Vaginal Pap smear, abnormal. She does not have any pertinent problems on file. She  has a past surgical history that includes Colposcopy (2008,12/28/14); Abdominal hysterectomy (11/2009); Cesarean section (2008); and Tonsillectomy. Her family history includes Atrial fibrillation in her mother; Cancer in her maternal aunt; Diabetes in her paternal grandmother and paternal uncle; Hypertension in her mother; Ovarian cancer in her maternal aunt; Thyroid disease in her mother. She  reports that she has never smoked. She has never used smokeless tobacco. She reports current alcohol use. She reports that she does not use drugs. She has a current medication list which includes the following prescription(s): biotin, estradiol, levonorgestrel-ethinyl estradiol, and metronidazole. She has No Known Allergies.       Review of Systems:  Review of Systems  Constitutional: Denied constitutional symptoms, night sweats, recent illness, fatigue, fever, insomnia and weight loss.  Eyes: Denied eye symptoms, eye pain, photophobia, vision change and visual disturbance.  Ears/Nose/Throat/Neck: Denied ear, nose, throat or neck symptoms, hearing loss, nasal  discharge, sinus congestion and sore throat.  Cardiovascular: Denied cardiovascular symptoms, arrhythmia, chest pain/pressure, edema, exercise intolerance, orthopnea and palpitations.  Respiratory: Denied pulmonary symptoms, asthma, pleuritic pain, productive sputum, cough, dyspnea and wheezing.  Gastrointestinal: Denied, gastro-esophageal reflux, melena, nausea and vomiting.  Genitourinary: See HPI for additional information.  Musculoskeletal: Denied musculoskeletal symptoms, stiffness, swelling, muscle weakness and myalgia.  Dermatologic: Denied dermatology symptoms, rash and scar.  Neurologic: Denied neurology symptoms, dizziness, headache, neck pain and syncope.  Psychiatric: Denied psychiatric symptoms, anxiety and depression.  Endocrine: Denied endocrine symptoms including hot flashes and night sweats.   Meds:   Current Outpatient Medications on File Prior to Visit  Medication Sig Dispense Refill  . Biotin 1000 MCG tablet Take 1,000 mcg by mouth 3 (three) times daily.    Marland Kitchen estradiol (ESTRACE) 1 MG tablet Take 1 tablet (1 mg total) by mouth daily. (Patient not taking: Reported on 09/01/2019) 90 tablet 3   No current facility-administered medications on file prior to visit.    Objective:     Vitals:   09/01/19 0809  BP: 108/78  Pulse: 73              Physical examination General NAD, Conversant  HEENT Atraumatic; Op clear with mmm.  Normo-cephalic. Pupils reactive. Anicteric sclerae  Thyroid/Neck Smooth without nodularity or enlargement. Normal ROM.  Neck Supple.  Skin No rashes, lesions or ulceration. Normal palpated skin turgor. No nodularity.  Breasts: No masses or discharge.  Symmetric.  No axillary adenopathy.  Lungs: Clear to auscultation.No rales or wheezes. Normal Respiratory effort, no retractions.  Heart: NSR.  No murmurs or rubs appreciated. No periferal edema  Abdomen: Soft.  Non-tender.  No masses.  No HSM. No hernia  Extremities: Moves all appropriately.   Normal ROM for age. No lymphadenopathy.  Neuro: Oriented to PPT.  Normal mood. Normal affect.     Pelvic:   Vulva: Normal appearance.  No lesions.  Right labia with elongated mildly tender swelling.  No evidence of acute infection.  There is a small warty/HPV lesion present on the right labia-patient says this has been present for years without change.  Vagina: No lesions or abnormalities noted.  Increased thickened vaginal discharge.  Moderate vaginal atrophy  Support: Normal pelvic support.  Urethra No masses tenderness or scarring.  Meatus Normal size without lesions or prolapse.  Cervix: Surgically absent   Anus: Normal exam.  No lesions.  Perineum: Normal exam.  No lesions.        Bimanual   Uterus: Surgically absent   Adnexae: No masses.  Non-tender to palpation.  Cul-de-sac: Negative for abnormality.   WET PREP: clue cells: present, KOH (yeast): negative, odor: present and trichomoniasis: negative Ph:  > 4.5   Assessment:    G2P0 Patient Active Problem List   Diagnosis Date Noted  . Endometriosis 06/29/2015  . Status post vaginal hysterectomy 06/29/2015  . Surgical menopause 06/29/2015  . Anxiety 06/29/2015  . H/O degenerative disc disease 06/29/2015  . 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 Tobacco user 06/29/2015  . Dyspareunia in female 06/29/2015  . Family history of ovarian carcinoma 06/29/2015     1. Well woman exam with routine gynecological exam   2. Symptomatic menopausal or female climacteric states   3. Bacterial vulvovaginitis     Patient occasionally has hot flashes and menopausal symptoms.  She is in surgical menopause at a very young age.  (Endometriosis)  Patient has "no concerns" regarding STDs.  Right labial edema of unknown source.  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  Basic Screening Recommendations The basic screening recommendations for asymptomatic women were discussed with the patient during her visit.  The age-appropriate  recommendations were discussed with her and the rational for the tests reviewed.  When I am informed by the patient that another primary care physician has previously obtained the age-appropriate tests and they are up-to-date, only outstanding tests are ordered and referrals given as necessary.  Abnormal results of tests will be discussed with her when all of her results are completed.  Routine preventative health maintenance measures emphasized: Exercise/Diet/Weight control, Tobacco Warnings, Alcohol/Substance use risks and Stress Management 2.  Strongly recommend HRT because of her young age at surgical menopause.  We have discussed this in detail and she would like to begin HRT.  We have jointly decided upon [facility] as her best option at this time.  I do not believe this will reactivate endometriosis as I suspect this is now gone with no further source. I have discussed HRT with the patient in detail.  The risk/benefits of it were reviewed.  She understands that during menopause Estrogen decreases dramatically and that this results in an increased risk of cardiovascular disease as well as osteoporosis.  We have also discussed the fact that hot flashes often result from a decrease in Estrogen, and that by replacing Estrogen, they can often be alleviated.  We have discussed skin, vaginal and urinary tract changes that may also take place from this drop in Estrogen.  Emotional changes have also been linked to Estrogen and we have  briefly discussed this.  The benefits of HRT including decrease in hot flashes, vaginal dryness, and osteoporosis were discussed.  The emotional benefit and a possible change in her cardiovascular risk profile was also reviewed.  The risks associated with Hormone Replacement Therapy were also reviewed.  The use of unopposed Estrogen and its relationship to endometrial cancer was discussed.  The addition of Progesterone and its beneficial effect on endometrial cancer was also noted.   The fact that there has been no consistent definitive studies showing an increase in breast cancer in women who use HRT was discussed with the patient.  The possible side effects including breast tenderness, fluid retention, mood changes and vaginal bleeding were discussed.  The patient was informed that this is an elective medication and that she may choose not to take Hormone Replacement Therapy.  Literature on HRT was given, and I believe that after answering all of the patient's questions, she has an adequate and informed understanding of HRT.  Special emphasis on the WHI study, as well as several studies since that pertaining to the risks and benefits of estrogen replacement therapy were compared.  The possible limitations of these studies were discussed including the age stratification of the WHI study.  The possible role of Progesterone in these studies was discussed in detail.  I believe that the patient has an informed knowledge of the risks and benefits of HRT.  I have specifically discussed WHI findings and current updates.  Different type of hormone formulation and methods of taking hormone replacement therapy discussed.  3.  Unknown right labial swelling.  Expectant management.  If ulcers become present consider HSV testing.  If it becomes more swollen warm soaks and compresses. 4.  We will treat BV with Flagyl Orders No orders of the defined types were placed in this encounter.    Meds ordered this encounter  Medications  . metroNIDAZOLE (FLAGYL) 500 MG tablet    Sig: Take 1 tablet (500 mg total) by mouth 2 (two) times daily for 7 days.    Dispense:  14 tablet    Refill:  0  . Levonorgestrel-Ethinyl Estradiol (AMETHIA) 0.15-0.03 &0.01 MG tablet    Sig: Take 1 tablet by mouth at bedtime.    Dispense:  84 tablet    Refill:  1        F/U  Return in about 3 months (around 11/30/2019).  Finis Bud, M.D. 09/01/2019 8:55 AM

## 2019-09-01 NOTE — Progress Notes (Signed)
Patient comes in today for annual exam. She has had hysterectomy. Patient states that she has spot on right side of vagina that is inflamed. Patient states that she has had sex one time since 2016.

## 2020-02-03 DIAGNOSIS — J309 Allergic rhinitis, unspecified: Secondary | ICD-10-CM | POA: Diagnosis not present

## 2020-02-03 DIAGNOSIS — Z20822 Contact with and (suspected) exposure to covid-19: Secondary | ICD-10-CM | POA: Diagnosis not present

## 2020-08-25 ENCOUNTER — Telehealth: Payer: Self-pay

## 2020-08-25 NOTE — Telephone Encounter (Signed)
mychart message sent to patient

## 2020-09-28 DIAGNOSIS — Z20822 Contact with and (suspected) exposure to covid-19: Secondary | ICD-10-CM | POA: Diagnosis not present

## 2020-09-28 DIAGNOSIS — Z03818 Encounter for observation for suspected exposure to other biological agents ruled out: Secondary | ICD-10-CM | POA: Diagnosis not present

## 2021-05-28 ENCOUNTER — Emergency Department: Payer: BC Managed Care – PPO

## 2021-05-28 ENCOUNTER — Other Ambulatory Visit: Payer: Self-pay

## 2021-05-28 DIAGNOSIS — Y92511 Restaurant or cafe as the place of occurrence of the external cause: Secondary | ICD-10-CM | POA: Diagnosis not present

## 2021-05-28 DIAGNOSIS — S90425A Blister (nonthermal), left lesser toe(s), initial encounter: Secondary | ICD-10-CM | POA: Diagnosis not present

## 2021-05-28 DIAGNOSIS — L03116 Cellulitis of left lower limb: Secondary | ICD-10-CM | POA: Insufficient documentation

## 2021-05-28 DIAGNOSIS — X501XXA Overexertion from prolonged static or awkward postures, initial encounter: Secondary | ICD-10-CM | POA: Insufficient documentation

## 2021-05-28 DIAGNOSIS — S90822A Blister (nonthermal), left foot, initial encounter: Secondary | ICD-10-CM | POA: Diagnosis not present

## 2021-05-28 DIAGNOSIS — Y990 Civilian activity done for income or pay: Secondary | ICD-10-CM | POA: Diagnosis not present

## 2021-05-28 DIAGNOSIS — M7989 Other specified soft tissue disorders: Secondary | ICD-10-CM | POA: Diagnosis not present

## 2021-05-28 LAB — CBC WITH DIFFERENTIAL/PLATELET
Abs Immature Granulocytes: 0.02 10*3/uL (ref 0.00–0.07)
Basophils Absolute: 0 10*3/uL (ref 0.0–0.1)
Basophils Relative: 0 %
Eosinophils Absolute: 0.1 10*3/uL (ref 0.0–0.5)
Eosinophils Relative: 1 %
HCT: 40.1 % (ref 36.0–46.0)
Hemoglobin: 13.4 g/dL (ref 12.0–15.0)
Immature Granulocytes: 0 %
Lymphocytes Relative: 34 %
Lymphs Abs: 3 10*3/uL (ref 0.7–4.0)
MCH: 29.3 pg (ref 26.0–34.0)
MCHC: 33.4 g/dL (ref 30.0–36.0)
MCV: 87.7 fL (ref 80.0–100.0)
Monocytes Absolute: 0.4 10*3/uL (ref 0.1–1.0)
Monocytes Relative: 5 %
Neutro Abs: 5.2 10*3/uL (ref 1.7–7.7)
Neutrophils Relative %: 60 %
Platelets: 289 10*3/uL (ref 150–400)
RBC: 4.57 MIL/uL (ref 3.87–5.11)
RDW: 12.8 % (ref 11.5–15.5)
WBC: 8.7 10*3/uL (ref 4.0–10.5)
nRBC: 0 % (ref 0.0–0.2)

## 2021-05-28 NOTE — ED Triage Notes (Addendum)
Pt with three days of left foot blisters, redness and toe discoloration. Pt states is painful and does not itch currently. Pt with blistering noted to third and fourth toe with base included. Pt denies known injury or bite, but states foot is painful and there is swelling. Pt states she feels like there may be infection in her "bone".

## 2021-05-29 ENCOUNTER — Emergency Department
Admission: EM | Admit: 2021-05-29 | Discharge: 2021-05-29 | Disposition: A | Discharge status: Home / Self Care | Payer: BC Managed Care – PPO | Attending: Emergency Medicine | Admitting: Emergency Medicine

## 2021-05-29 DIAGNOSIS — L03119 Cellulitis of unspecified part of limb: Secondary | ICD-10-CM

## 2021-05-29 LAB — COMPREHENSIVE METABOLIC PANEL
ALT: 55 U/L — ABNORMAL HIGH (ref 0–44)
AST: 39 U/L (ref 15–41)
Albumin: 4.1 g/dL (ref 3.5–5.0)
Alkaline Phosphatase: 77 U/L (ref 38–126)
Anion gap: 8 (ref 5–15)
BUN: 16 mg/dL (ref 6–20)
CO2: 26 mmol/L (ref 22–32)
Calcium: 9 mg/dL (ref 8.9–10.3)
Chloride: 104 mmol/L (ref 98–111)
Creatinine, Ser: 0.73 mg/dL (ref 0.44–1.00)
GFR, Estimated: >60 mL/min (ref >60)
Glucose, Bld: 120 mg/dL — ABNORMAL HIGH (ref 70–99)
Potassium: 3.5 mmol/L (ref 3.5–5.1)
Sodium: 138 mmol/L (ref 135–145)
Total Bilirubin: 0.6 mg/dL (ref 0.3–1.2)
Total Protein: 7.1 g/dL (ref 6.5–8.1)

## 2021-05-29 MED ORDER — CEPHALEXIN 500 MG PO CAPS
500.0000 mg | ORAL_CAPSULE | Freq: Once | ORAL | Status: AC
  Administered 2021-05-29: 500 mg via ORAL
  Filled 2021-05-29: qty 1

## 2021-05-29 MED ORDER — IBUPROFEN 600 MG PO TABS
600.0000 mg | ORAL_TABLET | Freq: Once | ORAL | Status: AC
  Administered 2021-05-29: 600 mg via ORAL
  Filled 2021-05-29: qty 1

## 2021-05-29 MED ORDER — CEPHALEXIN 500 MG PO CAPS: 500.0000 mg | ORAL_CAPSULE | Freq: Three times a day (TID) | ORAL | 0 refills | Status: AC

## 2021-05-29 NOTE — ED Provider Notes (Signed)
[facility] Emergency Department Provider Note  ____________________________________________  Time seen: Approximately 3:07 AM  I have reviewed the triage vital signs and the nursing notes.   HISTORY  Chief Complaint foot blisters   HPI Kaitlyn Paul is a 35 y.o. female presents for evaluation of blister in her left foot.  She first noticed it 3 days ago.  She reports that the redness is gotten worse and she is having pain that started this evening.  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  She has not taken anything at home for the pain.  She initially thought it started with an insect bite.  She denies any fever or chills, denies any body aches.  She is complaining of constant moderate throbbing pain.   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 Authorizing Provider  cephALEXin (KEFLEX) 500 MG capsule Take 1 capsule (500 mg total) by mouth 3 (three) times daily for 7 days. 05/29/21 06/05/21 Yes Kevona Lupinacci, [place], MD  Biotin 1000 MCG tablet Take 1,000 mcg by mouth 3 (three) times daily.    [provider]  estradiol (ESTRACE) 1 MG  tablet Take 1 tablet (1 mg total) by mouth daily. Patient not taking: Reported on 09/01/2019 08/28/16   Defrancesco, Alanda Slim, MD  Levonorgestrel-Ethinyl Estradiol (AMETHIA) 0.15-0.03 &0.01 MG tablet Take 1 tablet by mouth at bedtime. 09/01/19   Harlin Heys, MD    Allergies Patient has no known allergies.  Family History  Problem Relation Age of Onset   Hypertension Mother    Thyroid disease Mother    Atrial fibrillation Mother    Cancer Maternal Aunt        ovarian   Ovarian cancer Maternal Aunt    Diabetes Paternal Uncle    Diabetes Paternal Grandmother    Breast cancer Neg Hx    Colon cancer Neg Hx     Social History Social History   Tobacco Use   Smoking status: Never   Smokeless tobacco: Never  Substance Use Topics   Alcohol use: Yes    Comment: occas   Drug use: No    Review of Systems  Constitutional: Negative for fever. Eyes: Negative for visual changes. ENT: Negative for sore throat. Neck: No neck pain  Cardiovascular: Negative for chest pain. Respiratory: Negative for shortness of breath. Gastrointestinal: Negative for abdominal pain, vomiting or diarrhea. Genitourinary: Negative for dysuria. Musculoskeletal: Negative for back pain. + L foot pain Skin: Negative for rash. Neurological: Negative for headaches, weakness or numbness. Psych: No SI or HI  ____________________________________________   PHYSICAL EXAM:  VITAL SIGNS: ED  Triage Vitals  Enc Vitals Group     BP 05/28/21 2331 129/89     Pulse Rate 05/28/21 2331 86     Resp 05/28/21 2331 18     Temp 05/28/21 2331 98.2 F (36.8 C)     Temp Source 05/28/21 2331 Oral     SpO2 05/28/21 2331 98 %     Weight 05/28/21 2332 180 lb (81.6 kg)     Height 05/28/21 2332 '5\' 2"'$  (1.575 m)     Head Circumference --      Peak Flow --      Pain Score 05/28/21 2332 2     Pain Loc --      Pain Edu? --      Excl. in [place]? --     Constitutional: Alert and oriented. Well appearing and in no apparent  distress. HEENT:      Head: Normocephalic and atraumatic.         Eyes: Conjunctivae are normal. Sclera is non-icteric.       Mouth/Throat: Mucous membranes are moist.       Neck: Supple with no signs of meningismus. Cardiovascular: Regular rate and rhythm.  Respiratory: Normal respiratory effort.  Musculoskeletal:  There is a blister on the base of the L 3rd toe dorsally with surrounding ecchymosis, no crepitus, no abscess, slightly warm and tender to palpation. Strong DP and PT pulses Neurologic: Normal speech and language. Face is symmetric. Moving all extremities. No gross focal neurologic deficits are appreciated. Skin: Skin is warm, dry and intact. No rash noted. Psychiatric: Mood and affect are normal. Speech and behavior are normal.     ____________________________________________   LABS (all labs ordered are listed, but only abnormal results are displayed)  Labs Reviewed  COMPREHENSIVE METABOLIC PANEL - Abnormal; Notable for the following components:      Result Value   Glucose, Bld 120 (*)    ALT 55 (*)    All other components within normal limits  CBC WITH DIFFERENTIAL/PLATELET   ____________________________________________  EKG  none  ____________________________________________  RADIOLOGY  I have personally reviewed the images performed during this visit and I agree with the Radiologist's read.   Interpretation by Radiologist:  DG Foot Complete Left  Result Date: 05/28/2021 CLINICAL DATA:  Swelling and pain EXAM: LEFT FOOT - COMPLETE 3+ VIEW COMPARISON:  None. FINDINGS: There is no evidence of fracture or dislocation. There is no evidence of arthropathy or other focal bone abnormality. Soft tissues are unremarkable. IMPRESSION: Negative. Electronically Signed   By: Donavan Foil M.D.   On: 05/28/2021 23:54     ____________________________________________   PROCEDURES  Procedure(s) performed: None Procedures Critical Care performed:   None ____________________________________________   INITIAL IMPRESSION / ASSESSMENT AND PLAN / ED COURSE  35 y.o. female presents for evaluation of blister in her left foot.  Please refer to exam and picture above.  Patient has what is seems like a insect bite with a blister surrounded by ecchymosis.  Possibly fire ant or spider bite.  No signs of a snakebite.  Slightly warm and tender to palpation therefore he might be developing early cellulitis.  No systemic signs or symptoms.  X-ray of the foot visualized by me with no signs of bony involvement.  Blood work is within normal limits.  Patient was given ibuprofen for pain and will be placed on Keflex 3 times daily for 7 days.  Discussed wound care and follow-up with PCP.  Discussed my standard return precautions for progression  of the redness, fever or any systemic symptoms      _____________________________________________ Please note:  Patient was evaluated in Emergency Department today for the symptoms described in the history of present illness. Patient was evaluated in the context of the global COVID-19 pandemic, which necessitated consideration that the patient might be at risk for infection with the SARS-CoV-2 virus that causes COVID-19. Institutional protocols and algorithms that pertain to the evaluation of patients at risk for COVID-19 are in a state of rapid change based on information released by regulatory bodies including the CDC and federal and state organizations. These policies and algorithms were followed during the patient's care in the ED.  Some ED evaluations and interventions may be delayed as a result of limited staffing during the pandemic.   [place] Controlled Substance Database was reviewed by me. ____________________________________________   FINAL CLINICAL IMPRESSION(S) / ED DIAGNOSES   Final diagnoses:  Cellulitis of foot      NEW MEDICATIONS STARTED DURING THIS VISIT:  ED Discharge Orders          Ordered     cephALEXin (KEFLEX) 500 MG capsule  3 times daily        05/29/21 0310             Note:  This document was prepared using Dragon voice recognition software and may include unintentional dictation errors.     Alfred Levins, [place], MD 05/29/21 [phone]

## 2021-05-29 NOTE — Discharge Instructions (Addendum)
Elevate the foot, apply ice, take ibuprofen 600 mg every 6 hours, take antibiotics as prescribed, use open toe shoes.  Return to the emergency room if the redness starts to progress, if you develop a fever, if the rash feels hot to the touch.  Otherwise follow-up with your primary care doctor in 3 days for wound recheck

## 2021-09-26 ENCOUNTER — Other Ambulatory Visit: Payer: Self-pay

## 2021-09-26 ENCOUNTER — Ambulatory Visit (INDEPENDENT_AMBULATORY_CARE_PROVIDER_SITE_OTHER): Payer: BC Managed Care – PPO | Admitting: Obstetrics and Gynecology

## 2021-09-26 ENCOUNTER — Encounter: Payer: Self-pay | Admitting: Obstetrics and Gynecology

## 2021-09-26 VITALS — BP 119/57 | HR 70 | Resp 16 | Ht 62.0 in | Wt 199.3 lb

## 2021-09-26 DIAGNOSIS — Z01419 Encounter for gynecological examination (general) (routine) without abnormal findings: Secondary | ICD-10-CM

## 2021-09-26 DIAGNOSIS — N951 Menopausal and female climacteric states: Secondary | ICD-10-CM

## 2021-09-26 DIAGNOSIS — Z79890 Hormone replacement therapy (postmenopausal): Secondary | ICD-10-CM

## 2021-09-26 MED ORDER — ESTRADIOL 0.05 MG/24HR TD PTWK: 0.0500 mg | MEDICATED_PATCH | TRANSDERMAL | 2 refills | Status: AC

## 2021-09-26 NOTE — Progress Notes (Signed)
HPI:      [name] is a 36 y.o. G2P0 who LMP was No LMP recorded. Patient has had a hysterectomy.  Subjective:   She presents today for her annual examination.  She states that she has been experiencing weight gain that is undesirable.  She has begun exercising and eating better since that time. 2 years ago at her last visit discussed ERT.  Patient tried it for very short time and stopped.  She is interested in restarting. She has very limited sexual activity and occasionally has pain with intercourse but not all the time. She has a history of endometriosis and has had a hysterectomy as well as oophorectomy.    Hx: The following portions of the patient's history were reviewed and updated as appropriate:             She  has a past medical history of Anxiety, DDD (degenerative disc disease), cervical, Vaginal atrophy, and Vaginal Pap smear, abnormal. She does not have any pertinent problems on file. She  has a past surgical history that includes Colposcopy (2008,12/28/14); Abdominal hysterectomy (11/2009); Cesarean section (2008); and Tonsillectomy. Her family history includes Atrial fibrillation in her mother; Cancer in her maternal aunt; Diabetes in her paternal grandmother and paternal uncle; Hypertension in her mother; Ovarian cancer in her maternal aunt; Thyroid disease in her mother. She  reports that she has never smoked. She has never used smokeless tobacco. She reports current alcohol use. She reports that she does not use drugs. She has a current medication list which includes the following prescription(s): estradiol, biotin, and levonorgestrel-ethinyl estradiol. She has No Known Allergies.       Review of Systems:  Review of Systems  Constitutional: Denied constitutional symptoms, night sweats, recent illness, fatigue, fever, insomnia and weight loss.  Eyes: Denied eye symptoms, eye pain, photophobia, vision change and visual disturbance.  Ears/Nose/Throat/Neck: Denied ear,  nose, throat or neck symptoms, hearing loss, nasal discharge, sinus congestion and sore throat.  Cardiovascular: Denied cardiovascular symptoms, arrhythmia, chest pain/pressure, edema, exercise intolerance, orthopnea and palpitations.  Respiratory: Denied pulmonary symptoms, asthma, pleuritic pain, productive sputum, cough, dyspnea and wheezing.  Gastrointestinal: Denied, gastro-esophageal reflux, melena, nausea and vomiting.  Genitourinary: Denied genitourinary symptoms including symptomatic vaginal discharge, pelvic relaxation issues, and urinary complaints.  Musculoskeletal: Denied musculoskeletal symptoms, stiffness, swelling, muscle weakness and myalgia.  Dermatologic: Denied dermatology symptoms, rash and scar.  Neurologic: Denied neurology symptoms, dizziness, headache, neck pain and syncope.  Psychiatric: Denied psychiatric symptoms, anxiety and depression.  Endocrine: See HPI for additional information.   Meds:   Current Outpatient Medications on File Prior to Visit  Medication Sig Dispense Refill   Biotin 1000 MCG tablet Take 1,000 mcg by mouth 3 (three) times daily. (Patient not taking: Reported on 09/26/2021)     Levonorgestrel-Ethinyl Estradiol (AMETHIA) 0.15-0.03 &0.01 MG tablet Take 1 tablet by mouth at bedtime. (Patient not taking: Reported on 09/26/2021) 84 tablet 1   No current facility-administered medications on file prior to visit.       Objective:     Vitals:   09/26/21 0819  BP: (!) 119/57  Pulse: 70  Resp: 16    Filed Weights   09/26/21 0819  Weight: 199 lb 4.8 oz (90.4 kg)              Physical examination General NAD, Conversant  HEENT Atraumatic; Op clear with mmm.  Normo-cephalic. Pupils reactive. Anicteric sclerae  Thyroid/Neck Smooth without nodularity or enlargement. Normal ROM.  Neck Supple.  Skin No rashes, lesions or ulceration. Normal palpated skin turgor. No nodularity.  Breasts: No masses or discharge.  Symmetric.  No axillary adenopathy.   Lungs: Clear to auscultation.No rales or wheezes. Normal Respiratory effort, no retractions.  Heart: NSR.  No murmurs or rubs appreciated. No periferal edema  Abdomen: Soft.  Non-tender.  No masses.  No HSM. No hernia  Extremities: Moves all appropriately.  Normal ROM for age. No lymphadenopathy.  Neuro: Oriented to PPT.  Normal mood. Normal affect.     Pelvic:   Vulva: Normal appearance.  Warty excrescence noted at the apex of the labia -present at last exam  Vagina: No lesions or abnormalities noted.  Support: Normal pelvic support.  Urethra No masses tenderness or scarring.  Meatus Normal size without lesions or prolapse.  Cervix: Surgically absent  Anus: Normal exam.  No lesions.  Perineum: Normal exam.  No lesions.        Bimanual   Uterus: Surgically absent  Adnexae: No masses.  Non-tender to palpation.  Cul-de-sac: Negative for abnormality.     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 Well woman exam with routine gynecological exam   2. Symptomatic menopausal or female climacteric states   3. Postmenopausal hormone therapy     Patient continues to have menopausal symptoms because of surgical menopause at a young age of endometriosis.  She states that she is once again interested in beginning ERT.   Plan:            1.  Basic Screening Recommendations The basic screening recommendations for asymptomatic women were discussed with the patient during her visit.  The age-appropriate recommendations were discussed with her and the rational for the tests reviewed.  When I am informed by the patient that another primary care  physician has previously obtained the age-appropriate tests and they are up-to-date, only outstanding tests are ordered and referrals given as necessary.  Abnormal results of tests will be discussed with her when all of her results are completed.  Routine preventative health maintenance measures emphasized: Exercise/Diet/Weight control, Tobacco Warnings, Alcohol/Substance use risks and Stress Management Lab work ordered 2.  ERT begun using Climara patch.  The risks and benefits were again reviewed.  The issue of starting and stopping ERT discussed.  The increased risk noted for heart disease and stroke in patients to have been in menopause increasing amounts of time without ERT and then start.  Orders Orders Placed This Encounter  Procedures   CBC   Basic metabolic panel   Hemoglobin A1c   Lipid panel   TSH     Meds ordered this encounter  Medications   estradiol (CLIMARA - DOSED IN MG/24 HR) 0.05 mg/24hr patch    Sig: Place 1 patch (0.05 mg total) onto the skin once a week.    Dispense:  4 patch    Refill:  2          F/U  Return in about 3 months (around 12/25/2021).  Finis Bud, M.D. 09/26/2021 8:53 AM

## 2021-09-27 LAB — HEMOGLOBIN A1C
Est. average glucose Bld gHb Est-mCnc: 114 mg/dL
Hgb A1c MFr Bld: 5.6 % (ref 4.8–5.6)

## 2021-09-27 LAB — TSH: TSH: 1.56 u[IU]/mL (ref 0.450–4.500)

## 2021-09-27 LAB — BASIC METABOLIC PANEL
BUN/Creatinine Ratio: 25 — ABNORMAL HIGH (ref 9–23)
BUN: 17 mg/dL (ref 6–20)
CO2: 25 mmol/L (ref 20–29)
Calcium: 9.2 mg/dL (ref 8.7–10.2)
Chloride: 101 mmol/L (ref 96–106)
Creatinine, Ser: 0.69 mg/dL (ref 0.57–1.00)
Glucose: 91 mg/dL (ref 70–99)
Potassium: 4.3 mmol/L (ref 3.5–5.2)
Sodium: 141 mmol/L (ref 134–144)
eGFR: 115 mL/min/{1.73_m2} (ref >59)

## 2021-09-27 LAB — LIPID PANEL
Chol/HDL Ratio: 2.9 ratio (ref 0.0–4.4)
Cholesterol, Total: 166 mg/dL (ref 100–199)
HDL: 58 mg/dL (ref >39)
LDL Chol Calc (NIH): 90 mg/dL (ref 0–99)
Triglycerides: 100 mg/dL (ref 0–149)
VLDL Cholesterol Cal: 18 mg/dL (ref 5–40)

## 2021-09-27 LAB — CBC
Hematocrit: 43 % (ref 34.0–46.6)
Hemoglobin: 14.2 g/dL (ref 11.1–15.9)
MCH: 29.2 pg (ref 26.6–33.0)
MCHC: 33 g/dL (ref 31.5–35.7)
MCV: 89 fL (ref 79–97)
Platelets: 292 10*3/uL (ref 150–450)
RBC: 4.86 x10E6/uL (ref 3.77–5.28)
RDW: 13.2 % (ref 11.7–15.4)
WBC: 6.6 10*3/uL (ref 3.4–10.8)

## 2021-12-19 IMAGING — CR DG FOOT COMPLETE 3+V*L*
1 series · 3 of 3 positions shown · non-contrast
Comparison: None.

CLINICAL DATA: Swelling and pain

EXAM:
LEFT FOOT - COMPLETE 3+ VIEW

[Series 1: dg foot complete left · 0.14mm/px · 3 of 3 slices shown]
[im 1/3]
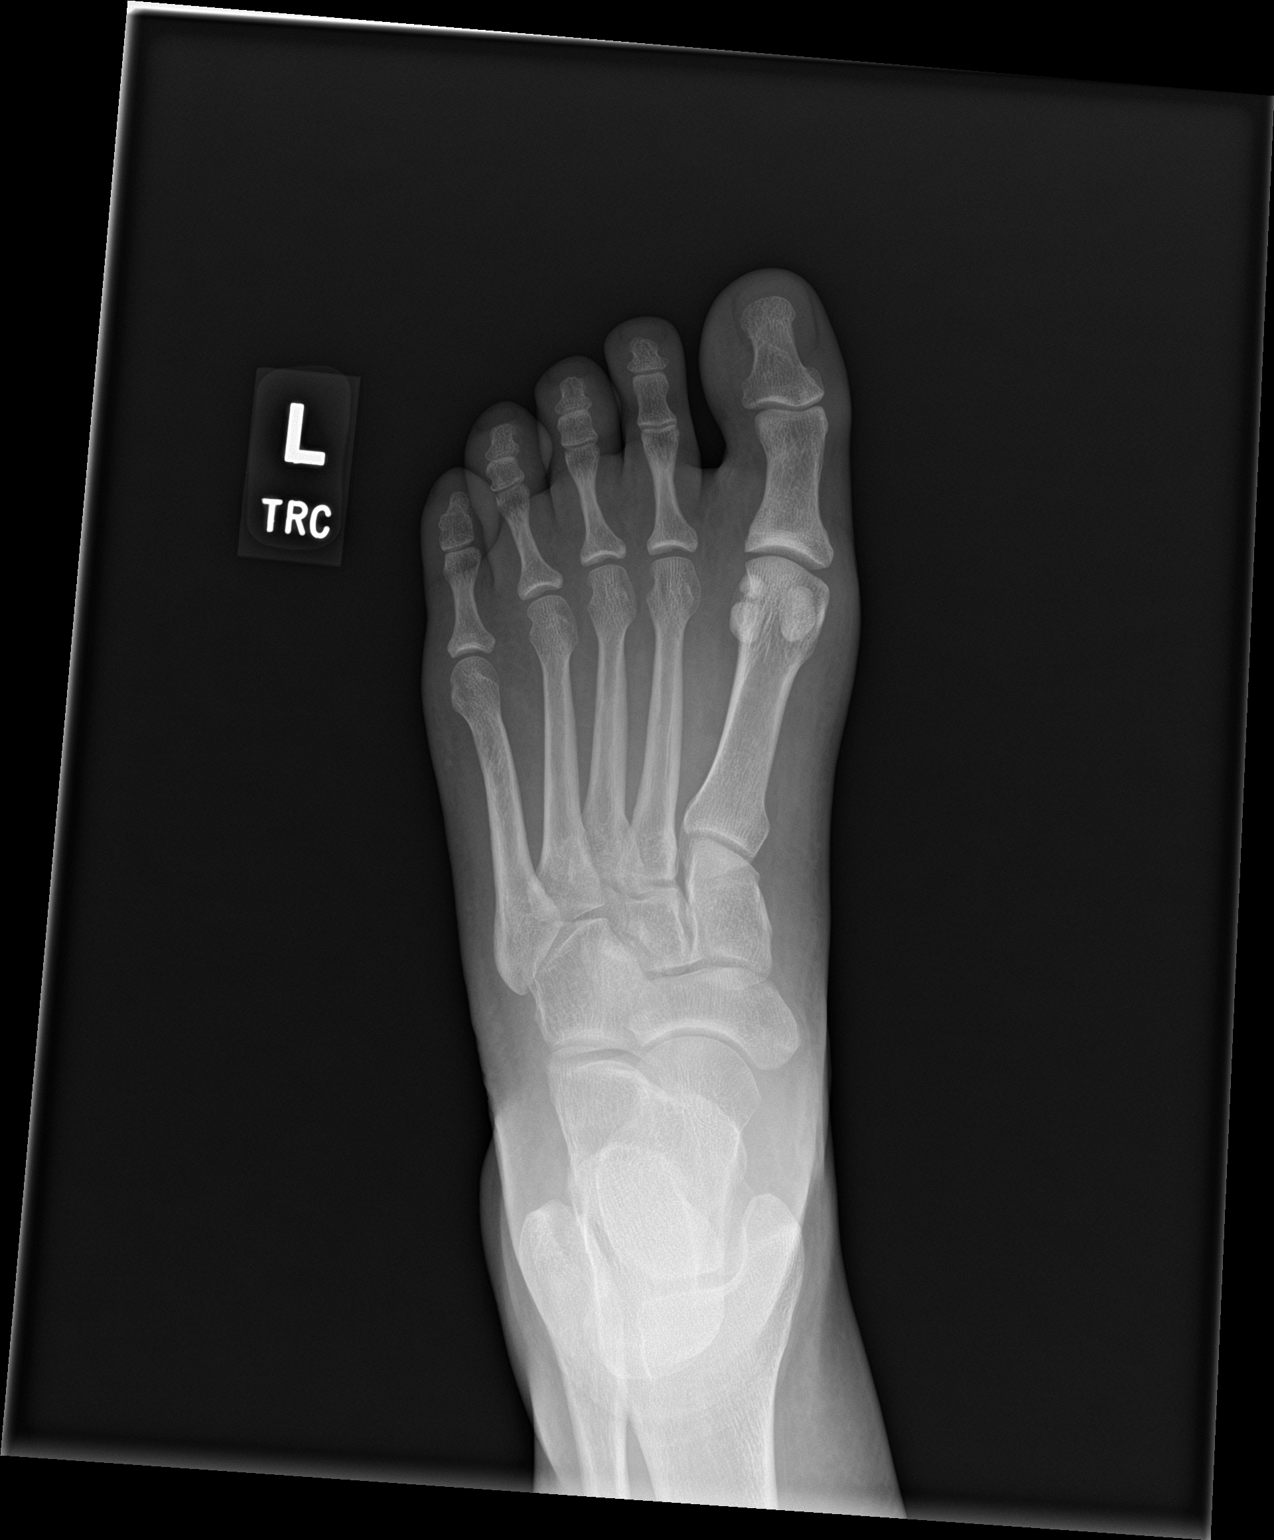
[im 2/3]
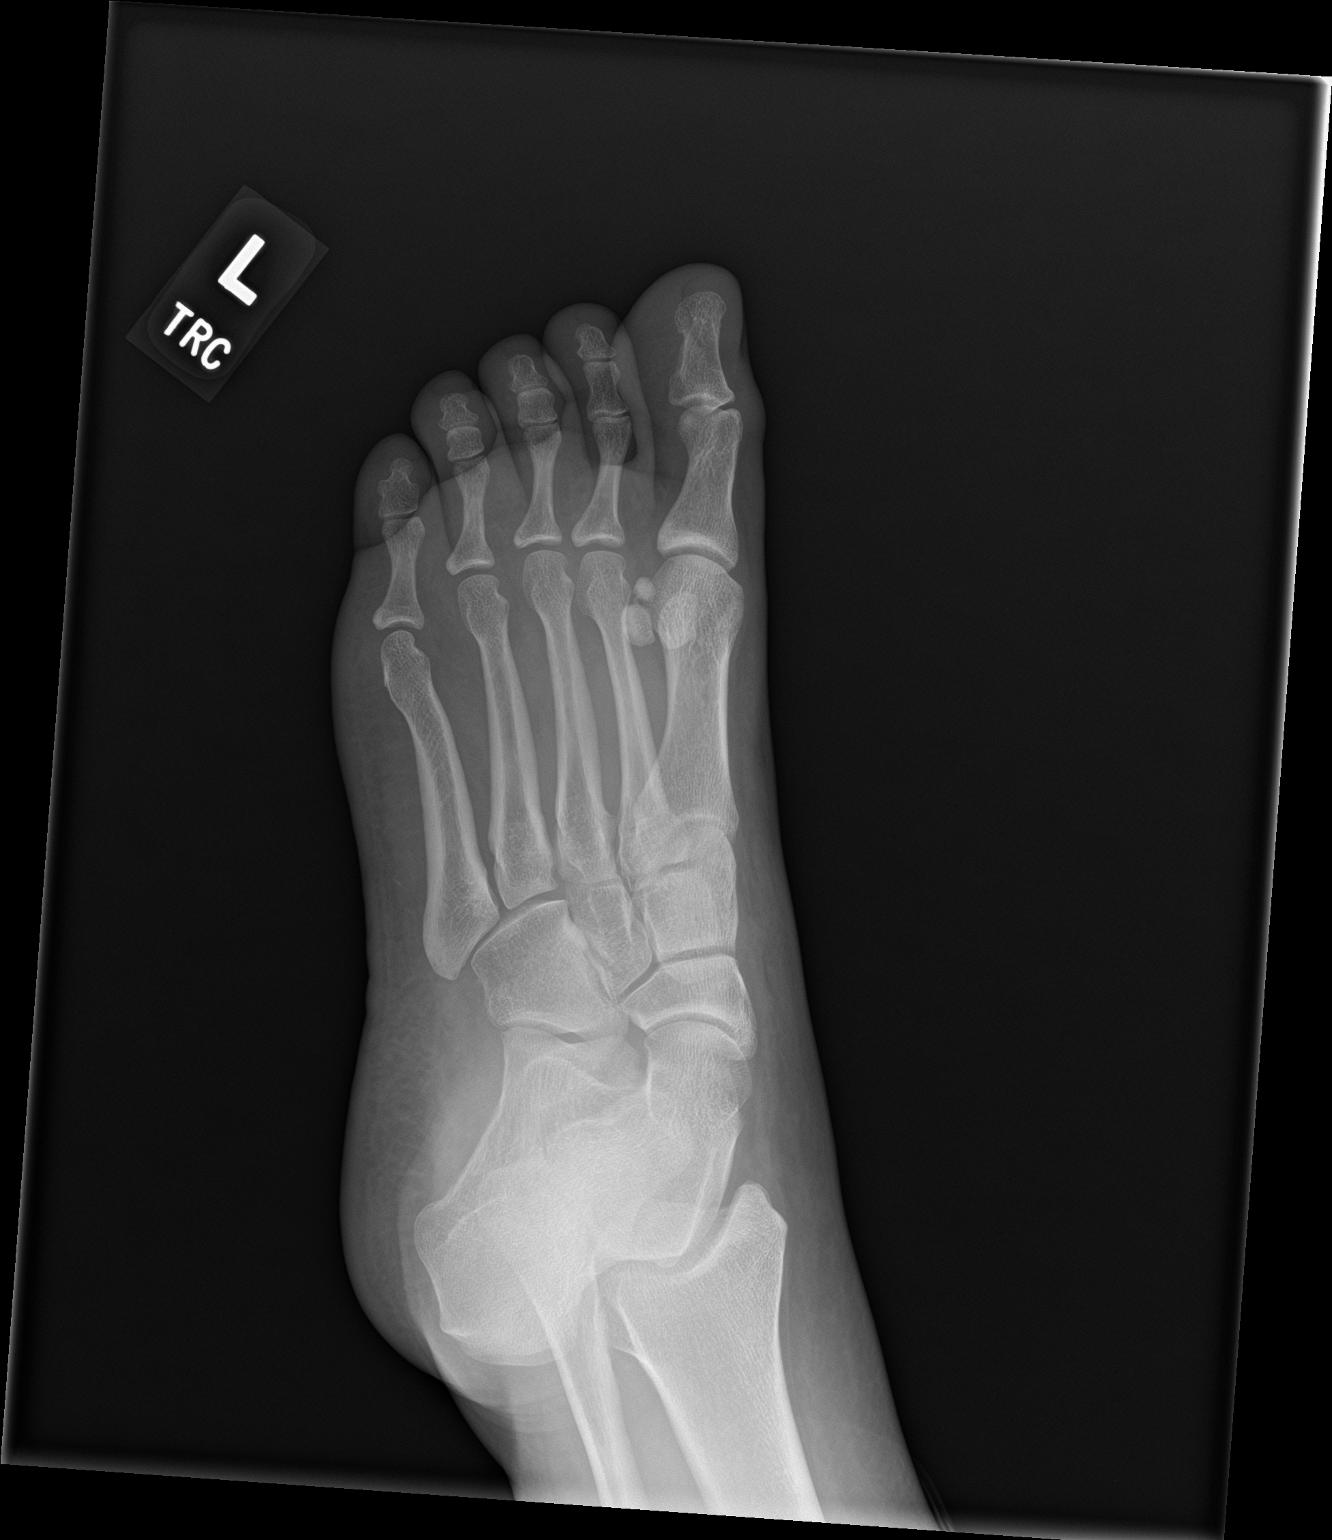
[im 3/3]
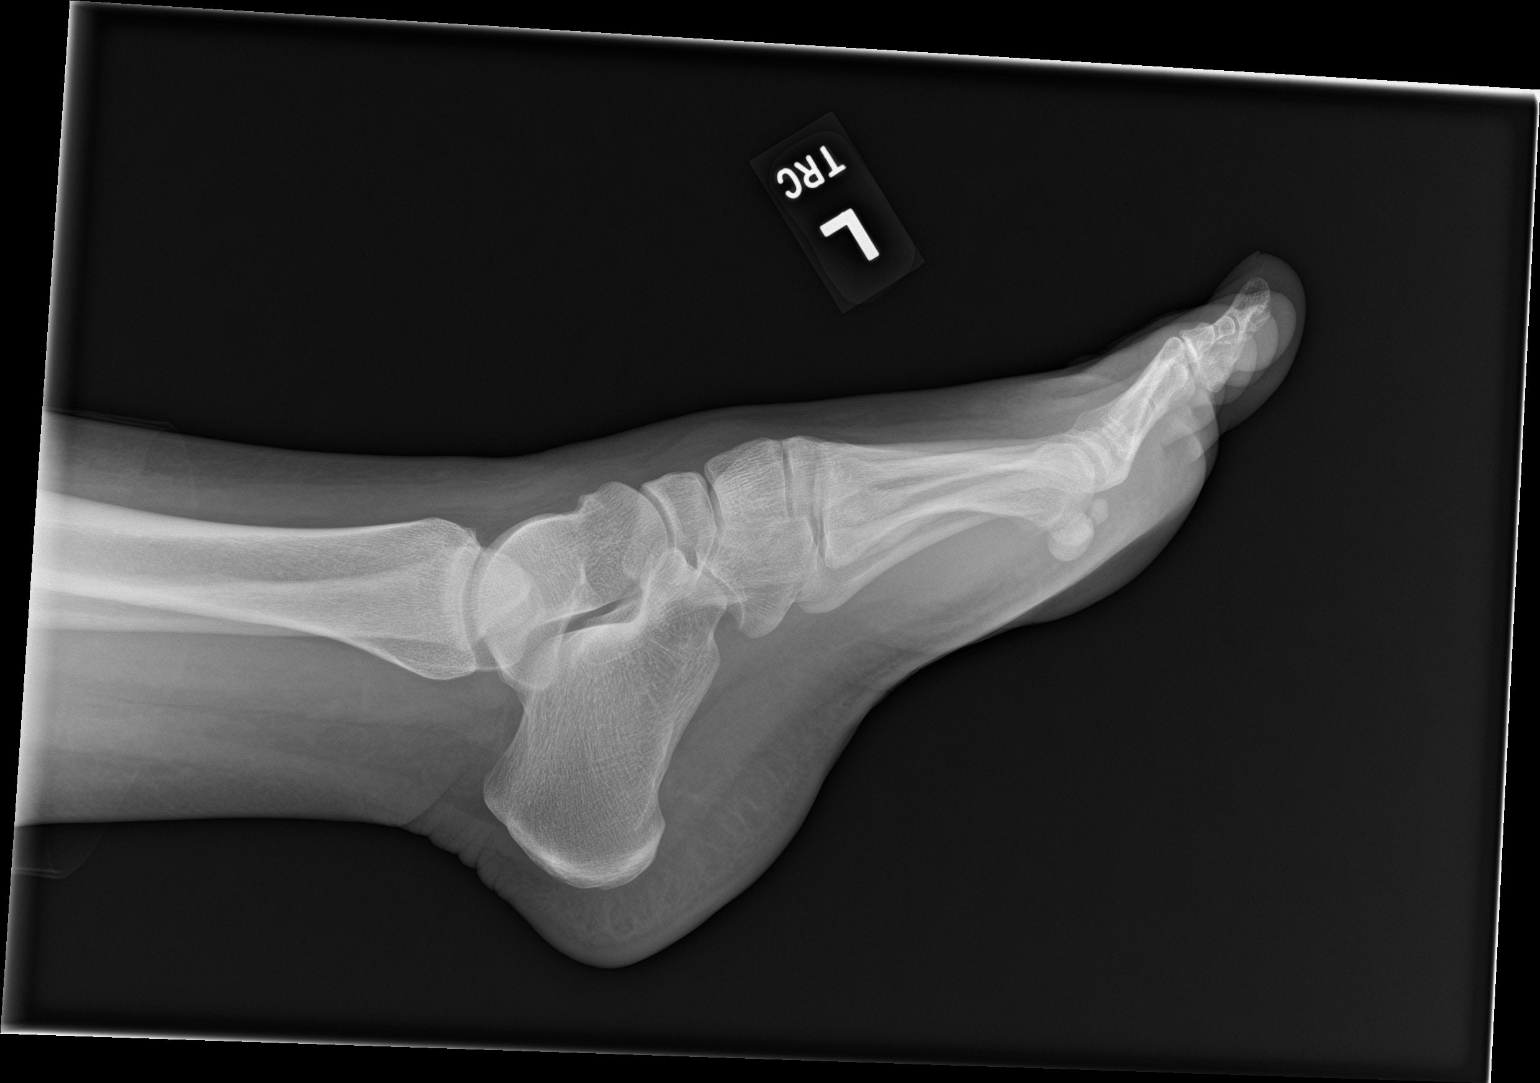

[3 of 3 positions shown; findings below may reference images not displayed]

FINDINGS: There is no evidence of fracture or dislocation. There is no
evidence of arthropathy or other focal bone abnormality. Soft
tissues are unremarkable.
IMPRESSION: Negative.

## 2022-09-20 DIAGNOSIS — Z13220 Encounter for screening for lipoid disorders: Secondary | ICD-10-CM | POA: Diagnosis not present

## 2022-09-20 DIAGNOSIS — Z0000 Encounter for general adult medical examination without abnormal findings: Secondary | ICD-10-CM | POA: Diagnosis not present

## 2022-09-20 DIAGNOSIS — M25552 Pain in left hip: Secondary | ICD-10-CM | POA: Diagnosis not present

## 2022-09-20 DIAGNOSIS — Z1331 Encounter for screening for depression: Secondary | ICD-10-CM | POA: Diagnosis not present

## 2022-09-20 DIAGNOSIS — Z90710 Acquired absence of both cervix and uterus: Secondary | ICD-10-CM | POA: Diagnosis not present

## 2022-09-20 DIAGNOSIS — Z131 Encounter for screening for diabetes mellitus: Secondary | ICD-10-CM | POA: Diagnosis not present

## 2022-09-20 DIAGNOSIS — N809 Endometriosis, unspecified: Secondary | ICD-10-CM | POA: Diagnosis not present

## 2022-09-20 DIAGNOSIS — E8940 Asymptomatic postprocedural ovarian failure: Secondary | ICD-10-CM | POA: Diagnosis not present

## 2022-11-18 DIAGNOSIS — J069 Acute upper respiratory infection, unspecified: Secondary | ICD-10-CM | POA: Diagnosis not present

## 2022-11-18 DIAGNOSIS — R6883 Chills (without fever): Secondary | ICD-10-CM | POA: Diagnosis not present

## 2022-11-18 DIAGNOSIS — J029 Acute pharyngitis, unspecified: Secondary | ICD-10-CM | POA: Diagnosis not present

## 2022-11-18 DIAGNOSIS — B9689 Other specified bacterial agents as the cause of diseases classified elsewhere: Secondary | ICD-10-CM | POA: Diagnosis not present

## 2024-05-19 ENCOUNTER — Other Ambulatory Visit (HOSPITAL_COMMUNITY): Payer: Self-pay
